# Patient Record
Sex: Female | Born: 1985 | Race: White | Hispanic: No | Marital: Single | Smoking: Current every day smoker
Health system: Southern US, Community
[De-identification: ages and names within clinical notes are randomized; demographics above are authoritative.]

## PROBLEM LIST (undated history)

## (undated) ENCOUNTER — Ambulatory Visit: Admission: EM

## (undated) DIAGNOSIS — Z803 Family history of malignant neoplasm of breast: Secondary | ICD-10-CM

## (undated) DIAGNOSIS — F419 Anxiety disorder, unspecified: Secondary | ICD-10-CM

## (undated) DIAGNOSIS — F32A Depression, unspecified: Secondary | ICD-10-CM

## (undated) DIAGNOSIS — I10 Essential (primary) hypertension: Secondary | ICD-10-CM

## (undated) DIAGNOSIS — I509 Heart failure, unspecified: Secondary | ICD-10-CM

## (undated) DIAGNOSIS — K219 Gastro-esophageal reflux disease without esophagitis: Secondary | ICD-10-CM

## (undated) HISTORY — DX: Anxiety disorder, unspecified: F41.9

## (undated) HISTORY — PX: LEEP: SHX91

## (undated) HISTORY — DX: Family history of malignant neoplasm of breast: Z80.3

## (undated) HISTORY — DX: Essential (primary) hypertension: I10

## (undated) HISTORY — DX: Depression, unspecified: F32.A

## (undated) HISTORY — DX: Gastro-esophageal reflux disease without esophagitis: K21.9

---
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
## 2008-10-31 ENCOUNTER — Emergency Department: Payer: Self-pay | Admitting: Unknown Physician Specialty

## 2009-10-11 ENCOUNTER — Encounter: Payer: Self-pay | Admitting: Surgery

## 2009-11-07 ENCOUNTER — Encounter: Payer: Self-pay | Admitting: Surgery

## 2011-01-03 IMAGING — US US PELV - US TRANSVAGINAL
1 series · 17 of 25 positions shown · non-contrast
Comparison: none

REASON FOR EXAM: pain left lower pelvic
COMMENTS:

PROCEDURE:     US  - US PELVIS EXAM W/TRANSVAGINAL  - October 31, 2008  [DATE]
RESULT:     Pelvic ultrasound dated 10/31/2008
TECHNIQUE: Transabdominal and endovaginal imaging of the pelvis is obtained.

[Series 1: us pelv - us transvaginal · 17 of 62 slices shown]
[im 1/62]
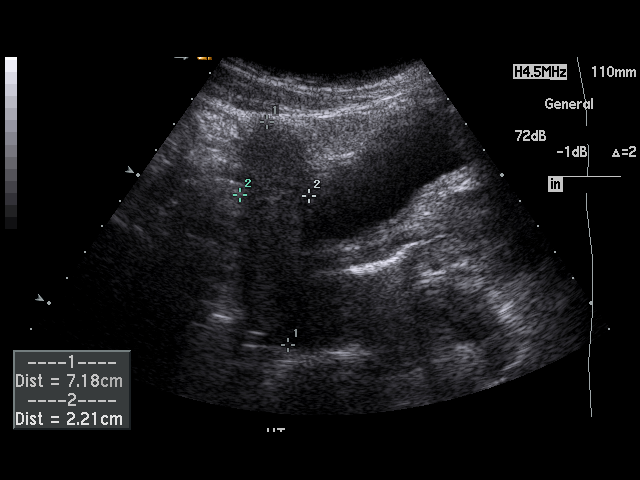
[im 6/62]
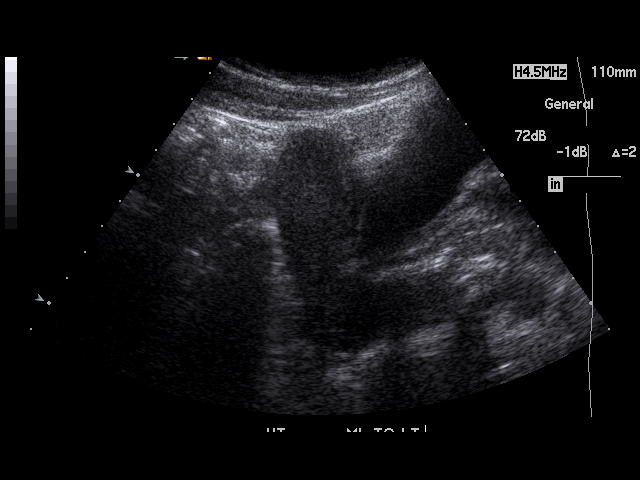
[im 8/62]
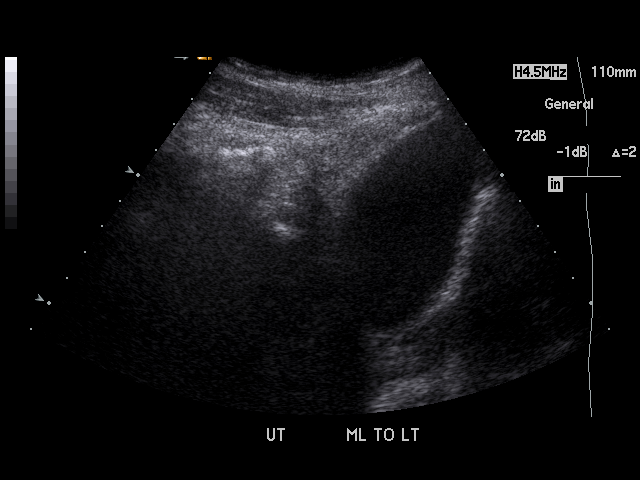
[im 13/62]
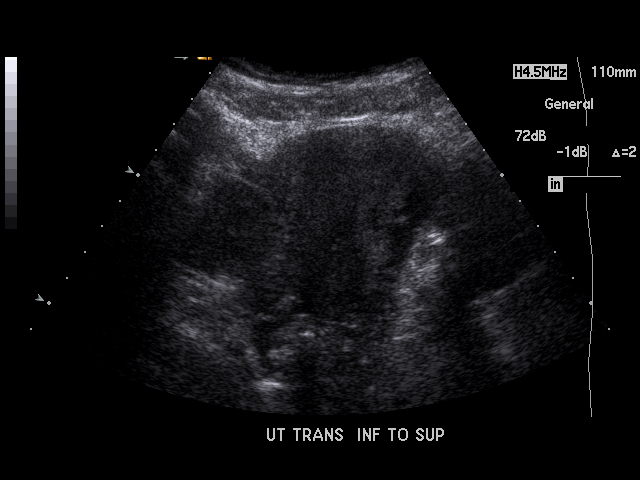
[im 16/62]
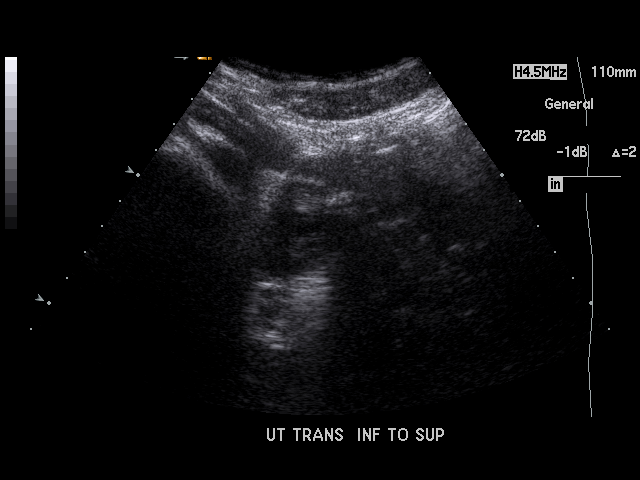
[im 21/62]
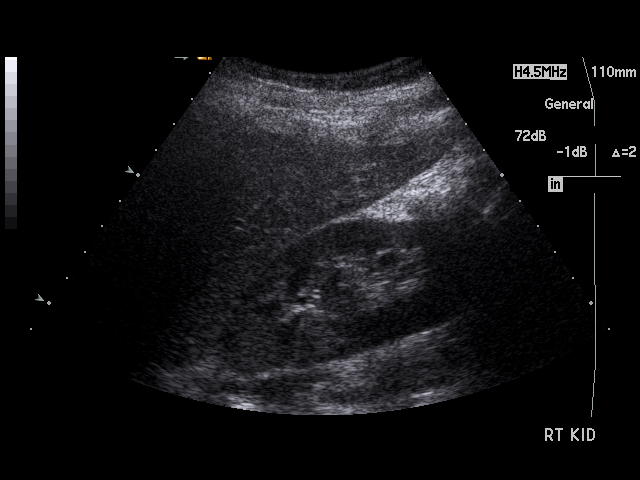
[im 23/62]
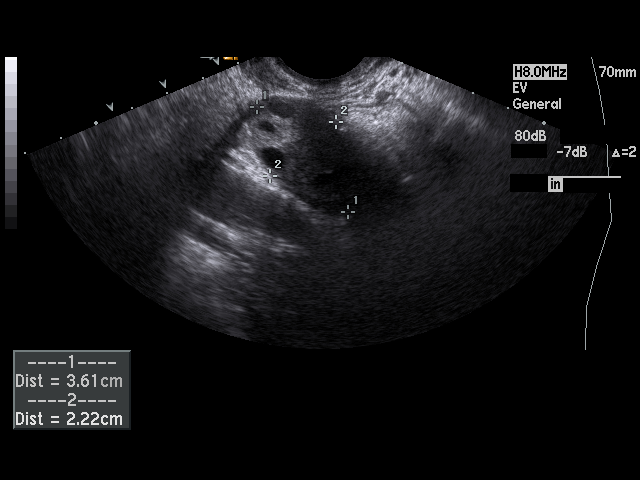
[im 28/62]
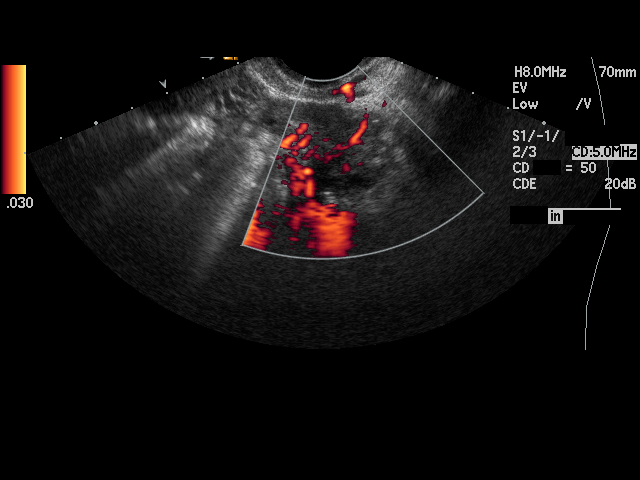
[im 31/62]
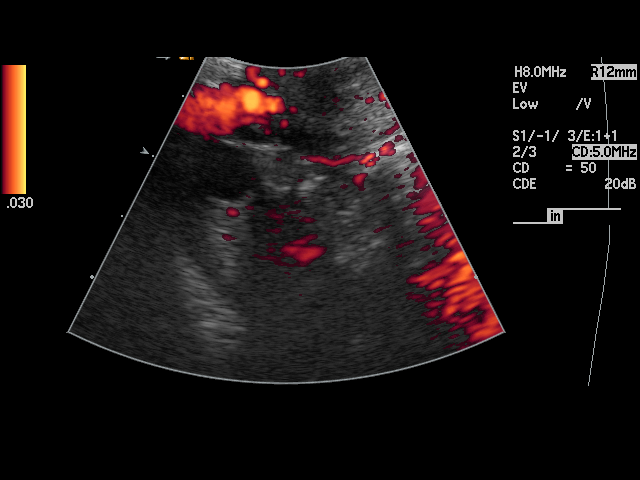
[im 34/62]
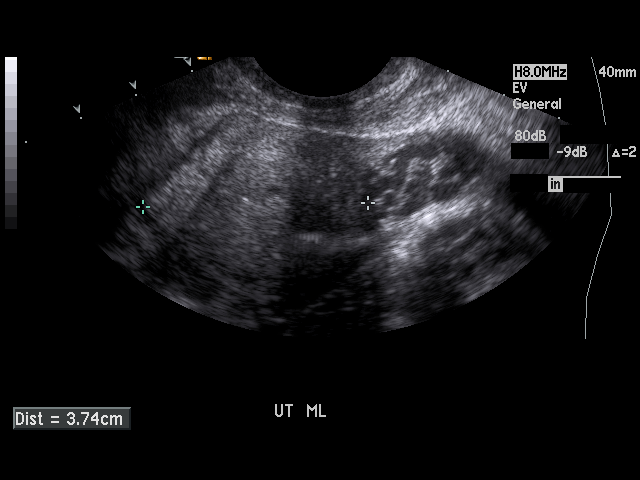
[im 39/62]
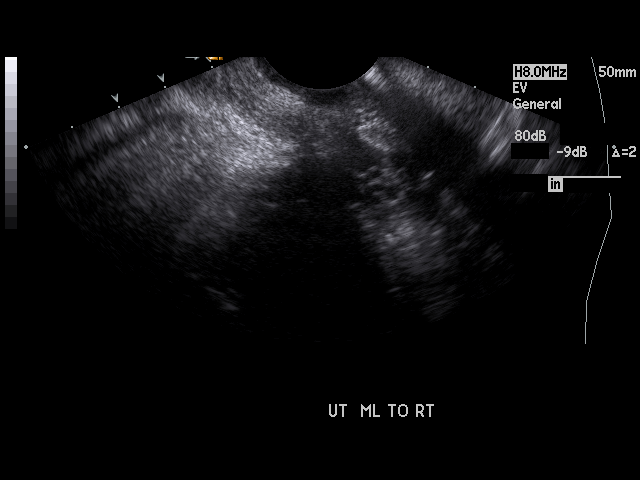
[im 41/62]
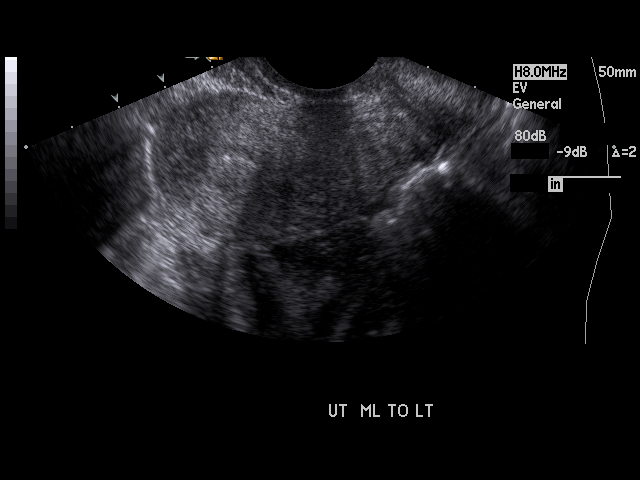
[im 46/62]
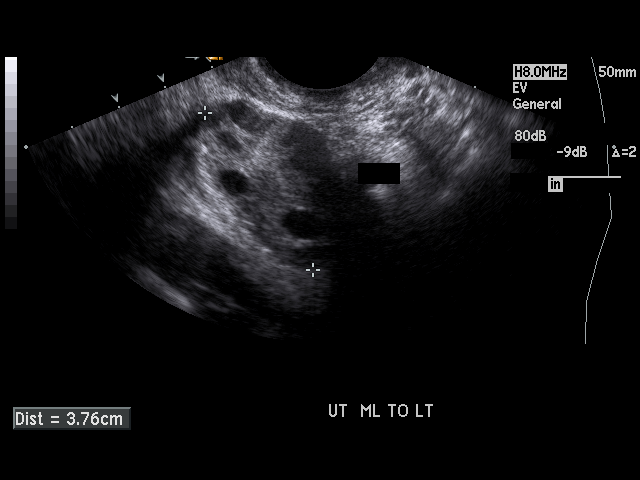
[im 49/62]
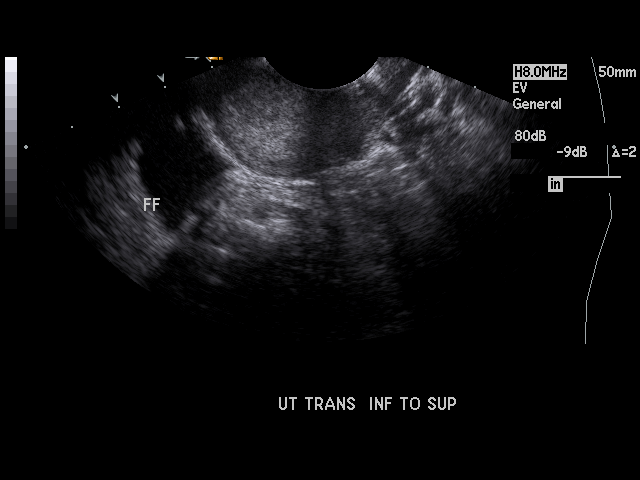
[im 54/62]
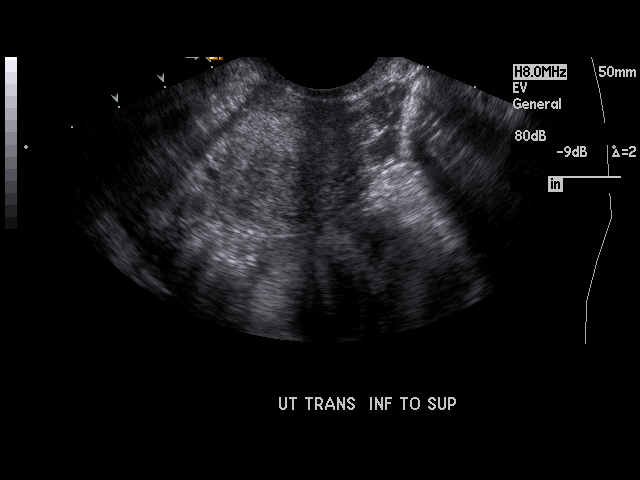
[im 56/62]
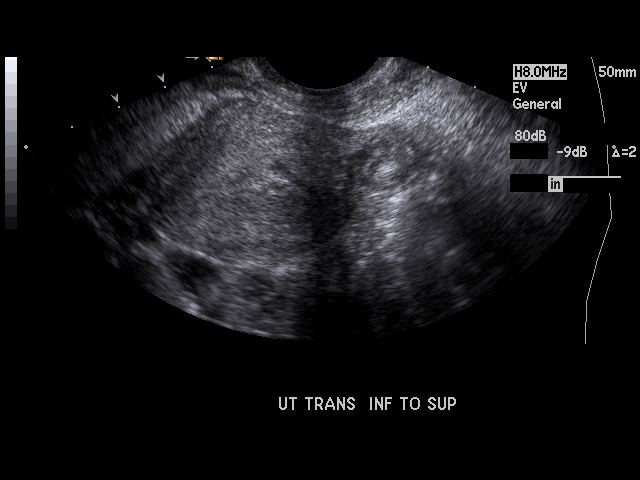
[im 62/62]
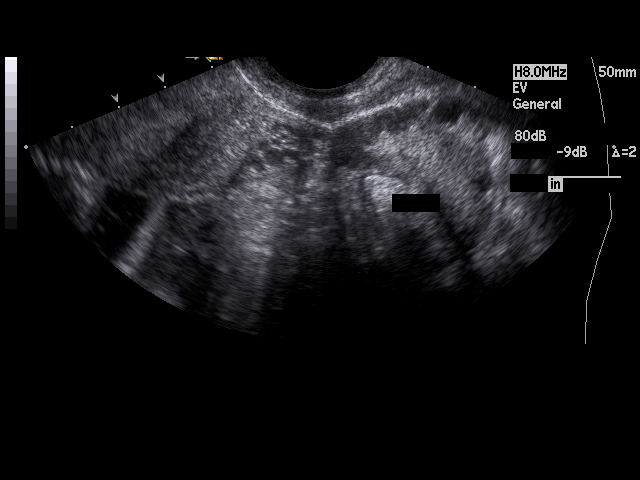

[17 of 25 positions shown; findings below may reference images not displayed]

The uterus demonstrates a homogeneous echotexture and measures 9.1 x 3.2 x
3.7 cm. A small amount of fluid is identified than the cul-de-sac. There is
no evidence of endometrial thickening. The right ovary measures 2.74 a 1.6 x
2.27 cm and the left 3.61 x 2.22 x 1.78 cm. Arterial and venous flow is
identified within the right and left ovaries. The ovaries demonstrate a
homogeneous echotexture. No evidence of pelvic mass is identified. The
urinary bladder is partially distended. Follicles are identified within the
right and left ovaries.
IMPRESSION: Unremarkable pelvic ultrasound. Small amount of fluid is
identified within the cul-de-sac this may be physiologic.

## 2012-04-04 ENCOUNTER — Emergency Department: Payer: Self-pay | Admitting: Emergency Medicine

## 2012-04-04 LAB — URINALYSIS, COMPLETE
Bilirubin,UR: NEGATIVE
Blood: NEGATIVE
Glucose,UR: NEGATIVE mg/dL (ref 0–75)
Ketone: NEGATIVE
Leukocyte Esterase: NEGATIVE
Nitrite: NEGATIVE
Ph: 7 (ref 4.5–8.0)
Protein: NEGATIVE
RBC,UR: NONE SEEN /HPF (ref 0–5)
Specific Gravity: 1.027 (ref 1.003–1.030)
Squamous Epithelial: 1
WBC UR: NONE SEEN /HPF (ref 0–5)

## 2013-07-14 ENCOUNTER — Ambulatory Visit: Payer: Self-pay

## 2013-12-22 DIAGNOSIS — H7292 Unspecified perforation of tympanic membrane, left ear: Secondary | ICD-10-CM | POA: Insufficient documentation

## 2014-06-07 IMAGING — CR DG LUMBAR SPINE 2-3V
1 series · 3 of 3 positions shown · non-contrast
Comparison: none

REASON FOR EXAM: lumbar spine pain
COMMENTS:

PROCEDURE:     DXR - DXR LUMBAR SPINE AP AND LATERAL  - April 04, 2012  [DATE]
RESULT:     Five non-rib bearing lumbar vertebral bodies are appreciated.
There is no evidence of fracture, dislocation or malalignment.

[Series 1: ap · 0.17mm/px · 3 of 3 slices shown]
[im 1/3]
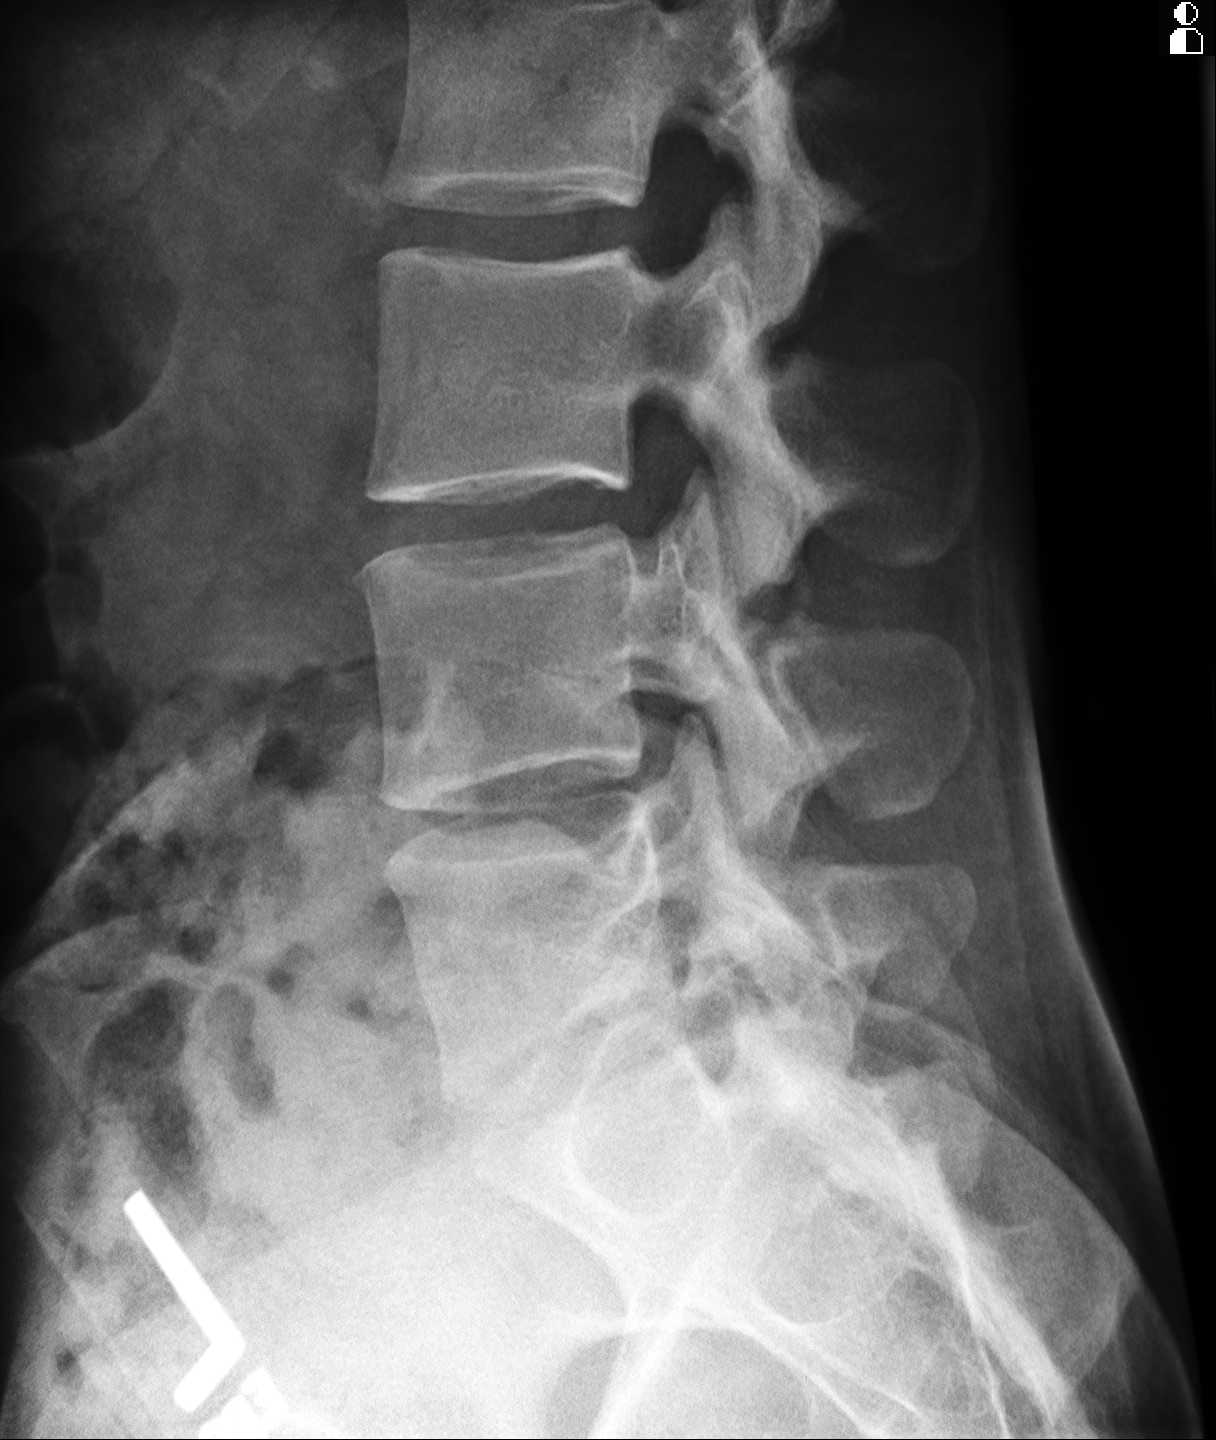
[im 2/3]
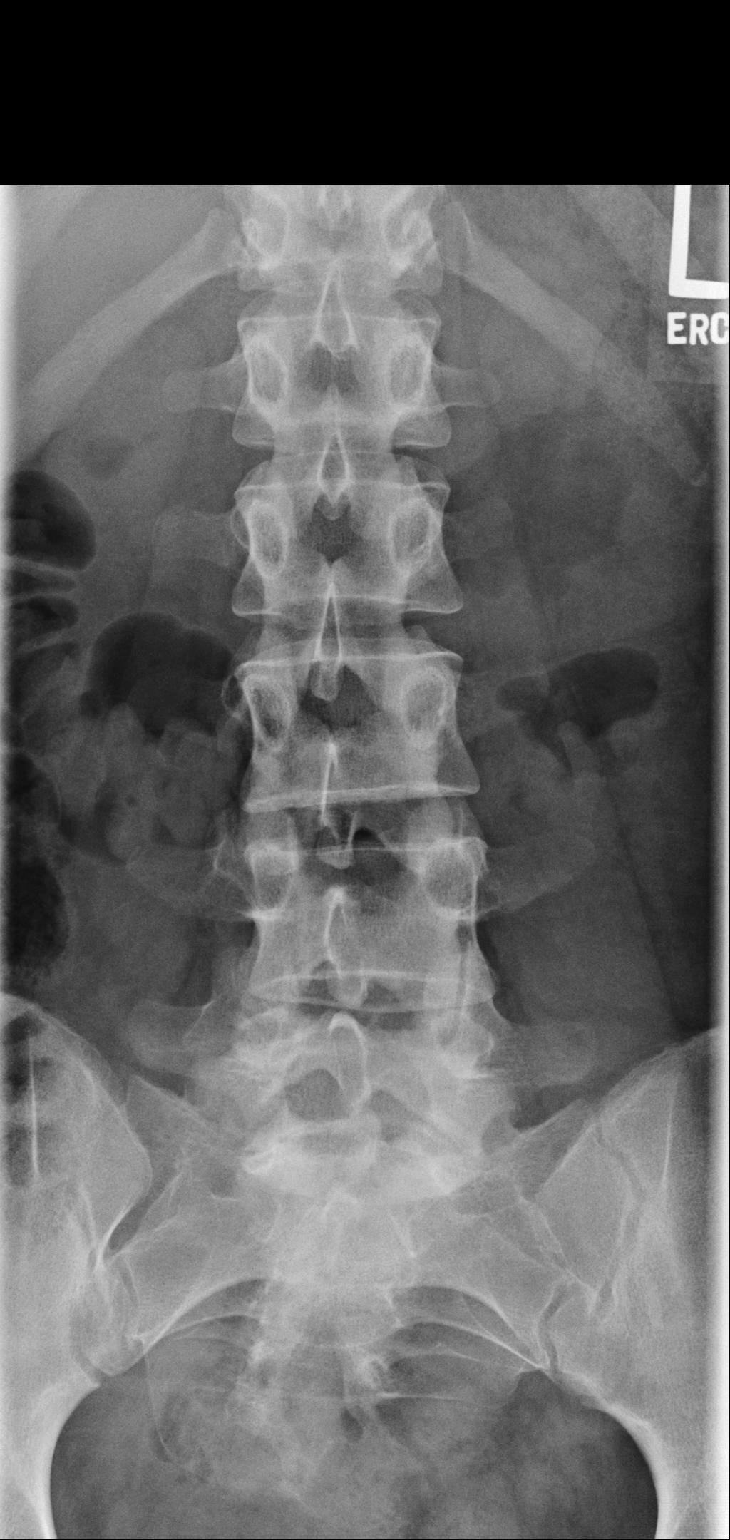
[im 3/3]
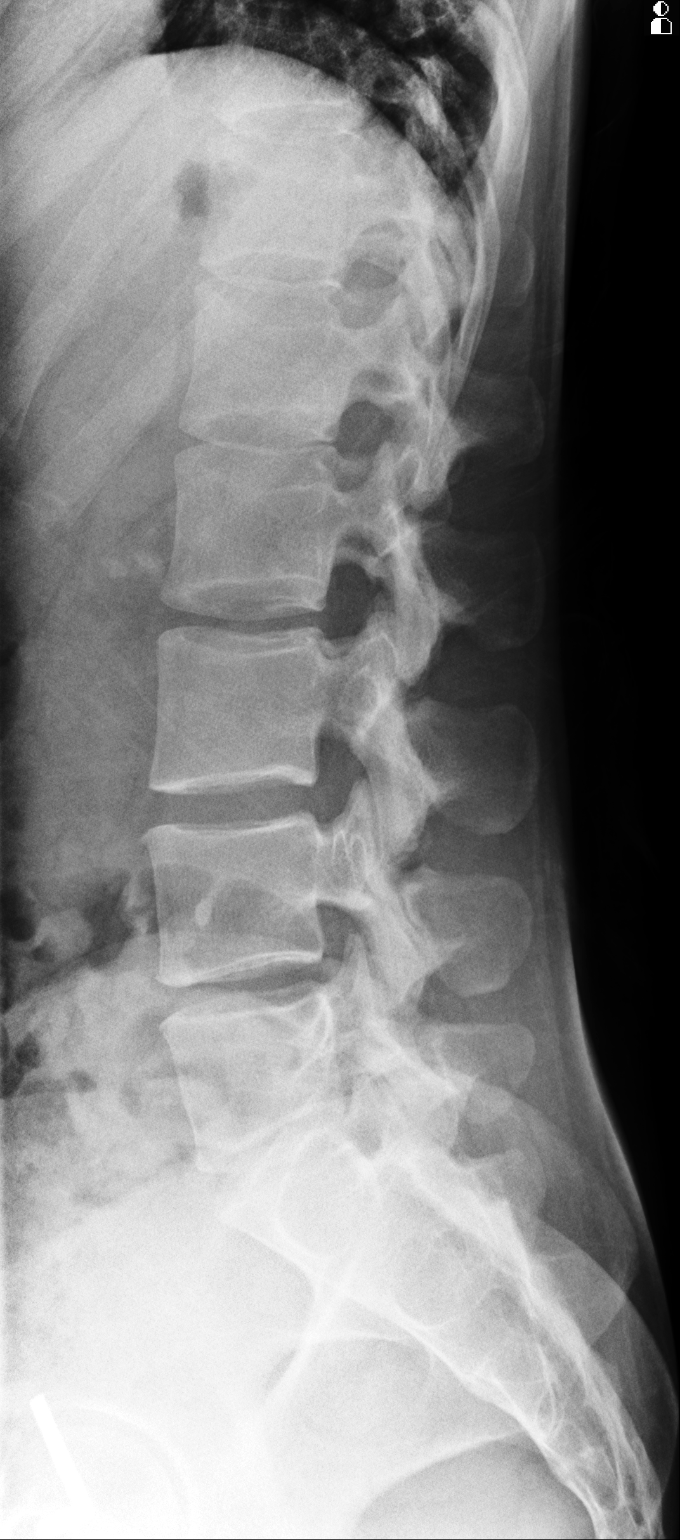

[3 of 3 positions shown; findings below may reference images not displayed]

IMPRESSION: 1. No acute osseous abnormalities.
2. If there is persistent clinical concern or persistent complaints of pain,
further evaluation with MRI is recommended.

## 2015-09-21 DIAGNOSIS — F329 Major depressive disorder, single episode, unspecified: Secondary | ICD-10-CM | POA: Insufficient documentation

## 2015-09-21 DIAGNOSIS — F32A Depression, unspecified: Secondary | ICD-10-CM | POA: Insufficient documentation

## 2015-09-21 DIAGNOSIS — F419 Anxiety disorder, unspecified: Secondary | ICD-10-CM | POA: Insufficient documentation

## 2015-09-21 DIAGNOSIS — Z8742 Personal history of other diseases of the female genital tract: Secondary | ICD-10-CM | POA: Insufficient documentation

## 2015-09-21 LAB — HM PAP SMEAR: HM Pap smear: NEGATIVE

## 2015-12-08 ENCOUNTER — Ambulatory Visit: Payer: Self-pay | Admitting: Family Medicine

## 2016-07-31 ENCOUNTER — Encounter: Payer: Self-pay | Admitting: Obstetrics & Gynecology

## 2018-02-18 LAB — HM HIV SCREENING LAB: HM HIV Screening: NEGATIVE

## 2018-08-12 DIAGNOSIS — Z8742 Personal history of other diseases of the female genital tract: Secondary | ICD-10-CM

## 2018-08-12 DIAGNOSIS — F419 Anxiety disorder, unspecified: Secondary | ICD-10-CM

## 2018-08-13 ENCOUNTER — Ambulatory Visit (LOCAL_COMMUNITY_HEALTH_CENTER): Payer: Self-pay

## 2018-08-13 ENCOUNTER — Other Ambulatory Visit: Payer: Self-pay

## 2018-08-13 VITALS — BP 119/75 | Ht 68.0 in | Wt 152.0 lb

## 2018-08-13 DIAGNOSIS — Z30013 Encounter for initial prescription of injectable contraceptive: Secondary | ICD-10-CM

## 2018-08-13 DIAGNOSIS — Z3009 Encounter for other general counseling and advice on contraception: Secondary | ICD-10-CM

## 2018-08-13 MED ORDER — MEDROXYPROGESTERONE ACETATE 150 MG/ML IM SUSP
150.0000 mg | Freq: Once | INTRAMUSCULAR | Status: AC
  Administered 2018-08-13: 16:00:00 150 mg via INTRAMUSCULAR

## 2018-08-13 NOTE — Progress Notes (Signed)
Depo given per E. Sciora CNM 12/08/17 order. Tolerated well. Aileen Fass, RN

## 2018-11-03 ENCOUNTER — Other Ambulatory Visit: Payer: Self-pay

## 2018-11-03 ENCOUNTER — Ambulatory Visit (LOCAL_COMMUNITY_HEALTH_CENTER): Payer: Self-pay

## 2018-11-03 VITALS — BP 118/75 | Ht 68.0 in | Wt 154.0 lb

## 2018-11-03 DIAGNOSIS — Z30013 Encounter for initial prescription of injectable contraceptive: Secondary | ICD-10-CM

## 2018-11-03 DIAGNOSIS — Z3009 Encounter for other general counseling and advice on contraception: Secondary | ICD-10-CM

## 2018-11-03 MED ORDER — MULTIVITAMINS PO CAPS: 1.0000 | ORAL_CAPSULE | Freq: Every day | ORAL | 0 refills | Status: DC

## 2018-11-03 MED ORDER — MEDROXYPROGESTERONE ACETATE 150 MG/ML IM SUSP
150.0000 mg | Freq: Once | INTRAMUSCULAR | Status: AC
  Administered 2018-11-03: 16:00:00 150 mg via INTRAMUSCULAR

## 2018-11-03 NOTE — Progress Notes (Signed)
Last physical at [facility] 12/08/2017. Last depo at [facility] 08/13/2018; 11.5 weeks post depo. DMPA 150 mg IM today per Donnal Moat, CNM order dated 12/08/2017.

## 2019-01-27 ENCOUNTER — Other Ambulatory Visit: Payer: Self-pay | Admitting: Physician Assistant

## 2019-01-27 DIAGNOSIS — Z3042 Encounter for surveillance of injectable contraceptive: Secondary | ICD-10-CM

## 2019-01-27 MED ORDER — MEDROXYPROGESTERONE ACETATE 150 MG/ML IM SUSP
150.0000 mg | Freq: Once | INTRAMUSCULAR | Status: AC
Start: 2019-01-28 — End: 2019-01-28
  Administered 2019-01-28: 17:00:00 150 mg via INTRAMUSCULAR

## 2019-01-28 ENCOUNTER — Other Ambulatory Visit: Payer: Self-pay

## 2019-01-28 ENCOUNTER — Ambulatory Visit (LOCAL_COMMUNITY_HEALTH_CENTER): Payer: Self-pay

## 2019-01-28 VITALS — BP 118/71 | Ht 68.0 in | Wt 166.0 lb

## 2019-01-28 DIAGNOSIS — Z30013 Encounter for initial prescription of injectable contraceptive: Secondary | ICD-10-CM

## 2019-01-28 DIAGNOSIS — Z3009 Encounter for other general counseling and advice on contraception: Secondary | ICD-10-CM

## 2019-01-29 NOTE — Progress Notes (Signed)
Pt here for Depo. Pt reports no issues with Depo and is satisfied with it as her birth control method. Pt's last Depo was 11/03/2018, so pt is 12 weeks and 2 days post last Depo. Pt received Depo 150mg  IM per , PA order from 01/27/2019, and pt counseled per 01/29/2019, PA to schedule to see a provider for her pap and depo when her next Depo is due. Pt states understanding.Sadie Haber, RN

## 2019-04-23 ENCOUNTER — Ambulatory Visit (LOCAL_COMMUNITY_HEALTH_CENTER): Payer: Medicaid Other

## 2019-04-23 ENCOUNTER — Other Ambulatory Visit: Payer: Self-pay

## 2019-04-23 DIAGNOSIS — Z3009 Encounter for other general counseling and advice on contraception: Secondary | ICD-10-CM

## 2019-04-23 DIAGNOSIS — Z719 Counseling, unspecified: Secondary | ICD-10-CM

## 2019-04-23 NOTE — Progress Notes (Signed)
See provider orders dated 01/27/19 and counseling done on 01/28/19 regarding PAP and depo.  Pt past due for PAP.  Unable to reschedule for provider visit today; pt to check her work schedule and will call back asking for a provider visit to address PAP and depo. Pt is 12.1 weeks post depo today and pt counseled regarding depo timeframe recommendations.

## 2019-04-26 ENCOUNTER — Ambulatory Visit (LOCAL_COMMUNITY_HEALTH_CENTER): Payer: Medicaid Other | Admitting: Family Medicine

## 2019-04-26 ENCOUNTER — Encounter: Payer: Self-pay | Admitting: Family Medicine

## 2019-04-26 ENCOUNTER — Ambulatory Visit: Payer: Medicaid Other

## 2019-04-26 ENCOUNTER — Other Ambulatory Visit: Payer: Self-pay

## 2019-04-26 VITALS — BP 107/71 | Ht 68.0 in | Wt 180.2 lb

## 2019-04-26 DIAGNOSIS — Z30013 Encounter for initial prescription of injectable contraceptive: Secondary | ICD-10-CM

## 2019-04-26 DIAGNOSIS — N841 Polyp of cervix uteri: Secondary | ICD-10-CM

## 2019-04-26 DIAGNOSIS — Z3009 Encounter for other general counseling and advice on contraception: Secondary | ICD-10-CM | POA: Diagnosis not present

## 2019-04-26 MED ORDER — MEDROXYPROGESTERONE ACETATE 150 MG/ML IM SUSP
150.0000 mg | INTRAMUSCULAR | Status: AC
  Administered 2019-04-26 – 2020-01-13 (×4): 150 mg via INTRAMUSCULAR

## 2019-04-26 NOTE — Progress Notes (Signed)
Here today for Depo and Pap Smear. Last PE here was 12/08/2017. Last Pap Smear was 09/16/2015. Last Depo was 01/28/2019. Tawny Hopping, RN

## 2019-04-26 NOTE — Progress Notes (Signed)
Family Planning Visit  Subjective:  Maria Gallegos is a 34 y.o. being seen today for  Chief Complaint  Patient presents with  . Contraception  . Gynecologic Exam    Pt has Depression; Anxiety; History of abnormal cervical Pap smear; and Perforated tympanic membrane, left on their problem list.  HPI  Patient reports she is here for Depo and pap. She is [redacted]w[redacted]d since last depo injection. She has been on depo 10+ yrs.   Pt denies all of the following, which are contraindications to Depo use: Known breast cancer Pregnancy Also denies: Hypertension (CDC cat 2 if mild, cat 3 if severe) Severe cirrhosis, hepatocellular adenoma Diabetes with nephrosis or vascular complications Ischemic heart disease or multiple risk factors for atherosclerotic disease, and some forms of lupus Unexplained vaginal bleeding Pregnancy planned within the next year Long-term use of corticosteroid therapy in women with a history of, or risk factors for, nontraumatic (frailty) fractures.  Current use of aminoglutethimide (usually for the treatment of Cushing's syndrome) because aminoglutethimide may increase metabolism of progestins    No LMP recorded. Patient has had an injection. Last sex: early Jan  BCM: depo Pt desires EC? n/a  Last pap: 09/21/2015: NIL, HPV negative. 09/19/2014: NIL, HPV neg. LEEP in 2015.  Last breast exam: ?  Patient reports 1 partner(s) in last year. Do they desire STI screening (if no, why not)? No, declines  Does the patient desire a pregnancy in the next year? No    34 y.o., Body mass index is 27.4 kg/m. - Is patient eligible for HA1C diabetes screening based on BMI and age >67?  no  Does the patient have a current or past history of drug use? no No components found for: HCV  See flowsheet for other program required questions.   Health Maintenance Due  Topic Date Due  . COVID-19 Vaccine (1) Never done  . TETANUS/TDAP  Never done  . PAP SMEAR-Modifier  09/21/2018     ROS  The following portions of the patient's history were reviewed and updated as appropriate: allergies, current medications, past family history, past medical history, past social history, past surgical history and problem list. Problem list updated.  Objective:  BP 107/71   Ht 5\' 8"  (1.727 m)   Wt 180 lb 3.2 oz (81.7 kg)   BMI 27.40 kg/m    Physical Exam Vitals and nursing note reviewed.  Constitutional:      Appearance: Normal appearance.  HENT:     Head: Normocephalic and atraumatic.     Mouth/Throat:     Mouth: Mucous membranes are moist.     Pharynx: Oropharynx is clear. No oropharyngeal exudate or posterior oropharyngeal erythema.  Pulmonary:     Effort: Pulmonary effort is normal.  Chest:     Breasts:        Right: Normal. No swelling, bleeding, inverted nipple, mass, nipple discharge, skin change or tenderness.        Left: Normal. No swelling, bleeding, inverted nipple, mass, nipple discharge, skin change or tenderness.  Abdominal:     General: Abdomen is flat.     Palpations: There is no mass.     Tenderness: There is no abdominal tenderness. There is no rebound.  Genitourinary:    General: Normal vulva.     Exam position: Lithotomy position.     Pubic Area: No rash or pubic lice.      Labia:        Right: No rash or lesion.  Left: No rash or lesion.      Vagina: Normal. No vaginal discharge, erythema, bleeding or lesions.     Cervix: No cervical motion tenderness, discharge, friability, lesion or erythema.     Uterus: Normal.      Adnexa: Right adnexa normal and left adnexa normal.     Rectum: Normal.        Comments: Cervix with beefy red vasculation obscuring os. Ill defined borders, s/p LEEP in 2015. Lymphadenopathy:     Head:     Right side of head: No preauricular or posterior auricular adenopathy.     Left side of head: No preauricular or posterior auricular adenopathy.     Cervical: No cervical adenopathy.     Upper Body:     Right  upper body: No supraclavicular or axillary adenopathy.     Left upper body: No supraclavicular or axillary adenopathy.     Lower Body: No right inguinal adenopathy. No left inguinal adenopathy.  Skin:    General: Skin is warm and dry.     Findings: No rash.  Neurological:     Mental Status: She is alert and oriented to person, place, and time.      Assessment and Plan:  Maria Gallegos is a 34 y.o. female presenting to the [facility] Department for a well woman exam/family planning visit  Contraception counseling: Reviewed all forms of birth control options in the tiered based approach including abstinence; over the counter/barrier methods; hormonal contraceptive medication including pill, patch, ring, injection, contraceptive implant; hormonal and nonhormonal IUDs; permanent sterilization options including vasectomy and the various tubal sterilization modalities. Risks, benefits, how to discontinue and typical effectiveness rates were reviewed.  Questions were answered.  Written information was also given to the patient to review.  Patient desires depo, this was prescribed for patient. She will follow up in  3 months for surveillance.  She was told to call with any further questions, or with any concerns about this method of contraception.  Emphasized use of condoms 100% of the time for STI prevention.  Emergency Contraception: n/a   1. Family planning services -Rx depo x1 yr. Counseling as above, including possible risks of Depo use >2 yrs to BMD. Alternative BCM discussed, pt would like to continue Depo. Suggested calcium (1200 mg daily) and vitamin D (800 IU daily) supplements, weight bearing exercise and avoiding cigarette smoking and excessive alcohol consumption to promote bone health.  -Follow up in 3 months for repeat Depo Provera injection.  -Pap today, though difficult to obtain d/t what is likely a cervical polyp.   - medroxyPROGESTERone (DEPO-PROVERA) injection 150  mg - IGP, Aptima HPV  2. Polyp at cervical os -Referred to [facility] gynecology for further investigation.     Return in about 3 months (around 07/26/2019) for Depo.  No future appointments.  Kandee Keen, PA-C

## 2019-04-26 NOTE — Progress Notes (Signed)
Depo given and tolerated well. Referral faxed to [facility]. Tawny Hopping, RN

## 2019-04-27 ENCOUNTER — Encounter: Payer: Self-pay | Admitting: Family Medicine

## 2019-04-29 LAB — IGP, APTIMA HPV
HPV Aptima: NEGATIVE
PAP Smear Comment: 0

## 2019-07-15 ENCOUNTER — Ambulatory Visit (LOCAL_COMMUNITY_HEALTH_CENTER): Payer: Medicaid Other

## 2019-07-15 ENCOUNTER — Other Ambulatory Visit: Payer: Self-pay

## 2019-07-15 VITALS — BP 118/74 | Ht 68.5 in | Wt 176.5 lb

## 2019-07-15 DIAGNOSIS — Z3009 Encounter for other general counseling and advice on contraception: Secondary | ICD-10-CM | POA: Diagnosis not present

## 2019-07-15 DIAGNOSIS — Z30013 Encounter for initial prescription of injectable contraceptive: Secondary | ICD-10-CM | POA: Diagnosis not present

## 2019-07-15 MED ORDER — MULTI-VITAMIN/MINERALS PO TABS: 1.0000 | ORAL_TABLET | Freq: Every day | ORAL | 0 refills | Status: DC

## 2019-07-15 NOTE — Progress Notes (Signed)
11 weeks 3 days post depo. DMPA 150 mg IM given today per order by Maximiano Coss, PA-C dated 04/26/19. Pt tolerated well. Next depo due 09/30/19. Consent signed 12/08/17 and on file. Multivitamins dispensed today. Jerel Shepherd, RN

## 2019-10-08 ENCOUNTER — Other Ambulatory Visit: Payer: Self-pay

## 2019-10-08 ENCOUNTER — Ambulatory Visit (LOCAL_COMMUNITY_HEALTH_CENTER): Payer: Medicaid Other

## 2019-10-08 VITALS — BP 127/75 | Ht 68.5 in | Wt 175.0 lb

## 2019-10-08 DIAGNOSIS — Z30013 Encounter for initial prescription of injectable contraceptive: Secondary | ICD-10-CM

## 2019-10-08 DIAGNOSIS — Z3009 Encounter for other general counseling and advice on contraception: Secondary | ICD-10-CM

## 2019-10-08 MED ORDER — MULTI-VITAMIN/MINERALS PO TABS: 1.0000 | ORAL_TABLET | Freq: Every day | ORAL | 0 refills | Status: DC

## 2019-10-08 NOTE — Progress Notes (Signed)
12 weeks 1 day post depo. Depo consent signed today. DMPA 150mg  IM given Left UOQ today per order by PA-C dated 04/26/2019. Tolerated well. Multivitamins dispensed today. Next depo due 12/24/2019, pt aware. 12/26/2019, RN

## 2020-01-13 ENCOUNTER — Other Ambulatory Visit: Payer: Self-pay

## 2020-01-13 ENCOUNTER — Ambulatory Visit (LOCAL_COMMUNITY_HEALTH_CENTER): Payer: Medicaid Other

## 2020-01-13 VITALS — BP 116/84 | Ht 68.5 in | Wt 182.0 lb

## 2020-01-13 DIAGNOSIS — Z30013 Encounter for initial prescription of injectable contraceptive: Secondary | ICD-10-CM | POA: Diagnosis not present

## 2020-01-13 DIAGNOSIS — Z3009 Encounter for other general counseling and advice on contraception: Secondary | ICD-10-CM | POA: Diagnosis not present

## 2020-01-13 NOTE — Progress Notes (Signed)
13 weeks 6 days since last depo. Depo 150 mg IM given R UOQ per order by Maximiano Coss, PA-C dated 04/26/2019. Tolerated well. Next depo due 03/30/2020, pt aware. Jerel Shepherd, RN

## 2020-01-14 ENCOUNTER — Ambulatory Visit: Payer: Medicaid Other

## 2020-03-31 ENCOUNTER — Ambulatory Visit (LOCAL_COMMUNITY_HEALTH_CENTER): Payer: Medicaid Other

## 2020-03-31 ENCOUNTER — Other Ambulatory Visit: Payer: Self-pay

## 2020-03-31 VITALS — BP 135/81 | Wt 178.0 lb

## 2020-03-31 DIAGNOSIS — Z30013 Encounter for initial prescription of injectable contraceptive: Secondary | ICD-10-CM

## 2020-03-31 DIAGNOSIS — Z3009 Encounter for other general counseling and advice on contraception: Secondary | ICD-10-CM | POA: Diagnosis not present

## 2020-03-31 MED ORDER — MEDROXYPROGESTERONE ACETATE 150 MG/ML IM SUSP
150.0000 mg | Freq: Once | INTRAMUSCULAR | Status: AC
  Administered 2020-03-31: 150 mg via INTRAMUSCULAR

## 2020-03-31 NOTE — Progress Notes (Signed)
Depo given per Maximiano Coss PA order (04/2019) in left glut.  Tolerated well. Co for next Depo, make PE appt. Richmond Campbell, RN

## 2020-06-30 ENCOUNTER — Ambulatory Visit (LOCAL_COMMUNITY_HEALTH_CENTER): Payer: Medicaid Other | Admitting: Advanced Practice Midwife

## 2020-06-30 ENCOUNTER — Other Ambulatory Visit: Payer: Self-pay

## 2020-06-30 VITALS — BP 141/80 | HR 84

## 2020-06-30 DIAGNOSIS — Z3042 Encounter for surveillance of injectable contraceptive: Secondary | ICD-10-CM | POA: Diagnosis not present

## 2020-06-30 DIAGNOSIS — R030 Elevated blood-pressure reading, without diagnosis of hypertension: Secondary | ICD-10-CM | POA: Insufficient documentation

## 2020-06-30 DIAGNOSIS — Z3009 Encounter for other general counseling and advice on contraception: Secondary | ICD-10-CM | POA: Diagnosis not present

## 2020-06-30 DIAGNOSIS — F17200 Nicotine dependence, unspecified, uncomplicated: Secondary | ICD-10-CM

## 2020-06-30 DIAGNOSIS — F1010 Alcohol abuse, uncomplicated: Secondary | ICD-10-CM

## 2020-06-30 DIAGNOSIS — Z8742 Personal history of other diseases of the female genital tract: Secondary | ICD-10-CM

## 2020-06-30 LAB — WET PREP FOR TRICH, YEAST, CLUE
Trichomonas Exam: NEGATIVE
Yeast Exam: NEGATIVE

## 2020-06-30 MED ORDER — MEDROXYPROGESTERONE ACETATE 150 MG/ML IM SUSP
150.0000 mg | INTRAMUSCULAR | Status: AC
  Administered 2020-06-30 – 2021-04-20 (×4): 150 mg via INTRAMUSCULAR

## 2020-06-30 NOTE — Progress Notes (Signed)
Here for PE , would like to continue her depo. Due for one. Last depo on 03/31/20. Last Pap 04/26/19 NILM. BP 141/80, BP was rechecked end of visit, 115/75.

## 2020-06-30 NOTE — Progress Notes (Signed)
Wet mount reviewed. No tx per standing orders.All providers orders completed.

## 2020-06-30 NOTE — Progress Notes (Signed)
[facility] DEPARTMENT [facility] [address] Main Number: [phone]    Family Planning Visit- Initial Visit  Subjective:  Maria Gallegos is a 35 y.o. SWF smoker G1P1001 (33 yo daughter)  being seen today for an initial annual visit and to get more DMPA.  The patient is currently using Depo Provera for pregnancy prevention. Patient reports she does not want a pregnancy in the next year.  Patient has the following medical conditions has Depression; Anxiety; History of abnormal cervical Pap smear; Perforated tympanic membrane, left; and Elevated blood pressure reading 141/80 on 06/30/20 on their problem list.  Chief Complaint  Patient presents with   Annual Exam    Patient reports wants more DMPA. Last sex 2021. Smoker 1/2-1 ppd. Last ETOH last night (1 beer +1 shot) daily. Last PE 04/26/19. Last CBE 04/26/19. LMP not on DMPA. Last pap 04/26/19 neg HPV neg. Cervical polyp removed 05/18/19 at [facility]: benign ectocervical mucosa with dilated submucosal vessels. LEEP 2015 with normal paps since. 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  Last DMPA 03/31/20  Patient denies vaping, cigars, MJ  There is no height or weight on file to calculate BMI. - Patient is eligible for diabetes screening based on BMI and age >31?  not applicable HA1C ordered? no  Patient reports 0  partner/s in last year. Desires STI screening?  Yes  Has patient been screened once for HCV in the past?  No  No results found for: HCVAB  Does the patient have current drug use (including MJ), have a partner with drug use, and/or has been incarcerated since last result? No  If yes-- Screen for HCV through [facility]   Does the patient meet criteria for HBV testing? No  Criteria:  -Household, sexual or needle sharing contact with HBV -History of drug use -HIV positive -Those with known Hep C   Health Maintenance Due  Topic Date Due   COVID-19 Vaccine (1) Never done    Pneumococcal Vaccine 60-33 Years old (1 - PCV) Never done   Hepatitis C Screening  Never done    Review of Systems  All other systems reviewed and are negative.  The following portions of the patient's history were reviewed and updated as appropriate: allergies, current medications, past family history, past medical history, past social history, past surgical history and problem list. Problem list updated.   See flowsheet for other program required questions.  Objective:   Vitals:   06/30/20 1430  BP: (!) 141/80  Pulse: 84    Physical Exam Constitutional:      Appearance: Normal appearance. She is obese.  HENT:     Head: Normocephalic and atraumatic.     Mouth/Throat:     Mouth: Mucous membranes are moist.     Comments: States last dental exam 2021 2x/yr Eyes:     Conjunctiva/sclera: Conjunctivae normal.  Neck:     Thyroid: No thyroid mass, thyromegaly or thyroid tenderness.  Cardiovascular:     Rate and Rhythm: Normal rate and regular rhythm.  Pulmonary:     Effort: Pulmonary effort is normal.     Breath sounds: Normal breath sounds.  Abdominal:     Palpations: Abdomen is soft.     Comments: Soft without masses or tenderness  Genitourinary:    General: Normal vulva.     Exam position: Lithotomy position.     Vagina: Vaginal discharge (creamy white leukorrhea, ph<4.5) present.     Rectum: Normal.  Musculoskeletal:        General: Normal range of motion.     Cervical back: Normal range of motion and neck supple.  Skin:    General: Skin is warm and dry.  Neurological:     Mental Status: She is alert.  Psychiatric:        Mood and Affect: Mood normal.      Assessment and Plan:  [name] is a 35 y.o. female presenting to the [facility] Department for an initial annual wellness/contraceptive visit  Contraception counseling: Reviewed all forms of birth control options in the tiered based approach. available including abstinence; over the  counter/barrier methods; hormonal contraceptive medication including pill, patch, ring, injection,contraceptive implant, ECP; hormonal and nonhormonal IUDs; permanent sterilization options including vasectomy and the various tubal sterilization modalities. Risks, benefits, and typical effectiveness rates were reviewed.  Questions were answered.  Written information was also given to the patient to review.  Patient desires continuation of DMPA, this was prescribed for patient. She will follow up in 11-13 wks for surveillance.  She was told to call with any further questions, or with any concerns about this method of contraception.  Emphasized use of condoms 100% of the time for STI prevention.  Patient was offered ECP. ECP was not accepted by the patient. ECP counseling was not given - see RN documentation  1. History of abnormal cervical Pap smear Last pap 04/26/19 neg HPV neg Next cotest due 04/2024  2. Family planning Treat wet mount per standing orders Immunization nurse consult  - WET PREP FOR TRICH, YEAST, CLUE - Chlamydia/Gonorrhea [facility]  3. Encounter for surveillance of injectable contraceptive May have DMPA 150 mg IM q 11-13 wks x 1 year  4. Elevated blood pressure reading 141/80 on 06/30/20 Please refer pt to primary care MD for evaluation Repeat BP=wnl     No follow-ups on file.  No future appointments.  Alberteen Spindle, CNM

## 2020-10-06 ENCOUNTER — Ambulatory Visit: Payer: Medicaid Other

## 2020-10-11 ENCOUNTER — Ambulatory Visit: Payer: Medicaid Other

## 2020-10-13 ENCOUNTER — Other Ambulatory Visit: Payer: Self-pay

## 2020-10-13 ENCOUNTER — Ambulatory Visit (LOCAL_COMMUNITY_HEALTH_CENTER): Payer: Medicaid Other | Admitting: Family Medicine

## 2020-10-13 ENCOUNTER — Ambulatory Visit: Payer: Medicaid Other

## 2020-10-13 ENCOUNTER — Encounter: Payer: Self-pay | Admitting: Family Medicine

## 2020-10-13 VITALS — BP 94/62 | HR 71 | Temp 97.0°F | Resp 18 | Ht 69.0 in | Wt 177.0 lb

## 2020-10-13 DIAGNOSIS — Z3009 Encounter for other general counseling and advice on contraception: Secondary | ICD-10-CM | POA: Diagnosis not present

## 2020-10-13 DIAGNOSIS — Z3042 Encounter for surveillance of injectable contraceptive: Secondary | ICD-10-CM | POA: Diagnosis not present

## 2020-10-13 NOTE — Progress Notes (Signed)
Patient is here for acute visit to receive depo injection.   Patient is 15wks post depo. Last depo was given on 06/30/20 during annual physical appointment with Hazle Coca, CNM.   Patient has no concerns nor complains. LMP is unknown as patient states she does not get period with depo. Last sex was "before my last depo".   Depo given today per Hazle Coca, CNM dated 06/30/20. Depo administered RUOQ  tolerated well. Next Depo due 12/29/20. Reminder card given to patient.    Floy Sabina, RN

## 2020-10-15 NOTE — Progress Notes (Signed)
This provider did not see patient.    Wendi Snipes, FNP

## 2021-01-19 ENCOUNTER — Ambulatory Visit (LOCAL_COMMUNITY_HEALTH_CENTER): Payer: Medicaid Other

## 2021-01-19 ENCOUNTER — Other Ambulatory Visit: Payer: Self-pay

## 2021-01-19 VITALS — BP 114/76 | Ht 69.0 in | Wt 174.0 lb

## 2021-01-19 DIAGNOSIS — Z3042 Encounter for surveillance of injectable contraceptive: Secondary | ICD-10-CM

## 2021-01-19 DIAGNOSIS — Z3009 Encounter for other general counseling and advice on contraception: Secondary | ICD-10-CM | POA: Diagnosis not present

## 2021-01-19 NOTE — Progress Notes (Signed)
DMPA 150 mg given IM (Left Glute) per 06/30/2020 order by Donnal Moat, CNM and tolerated well.  Patient is 14 weeks post last Depo. Dahlia Bailiff, RN

## 2021-04-06 ENCOUNTER — Ambulatory Visit: Payer: Medicaid Other

## 2021-04-20 ENCOUNTER — Ambulatory Visit (LOCAL_COMMUNITY_HEALTH_CENTER): Payer: Medicaid Other

## 2021-04-20 VITALS — BP 127/81 | Ht 69.0 in | Wt 178.0 lb

## 2021-04-20 DIAGNOSIS — Z3009 Encounter for other general counseling and advice on contraception: Secondary | ICD-10-CM | POA: Diagnosis not present

## 2021-04-20 DIAGNOSIS — Z3042 Encounter for surveillance of injectable contraceptive: Secondary | ICD-10-CM | POA: Diagnosis not present

## 2021-04-20 NOTE — Progress Notes (Signed)
13 weeks 0 days post depo. Voices no concerns. Depo given today per order by Hazle Coca, CNM dated 06/30/2020. Tolerated well RUOQ. Next depo and return PE due when next depo is due which is 07/06/2021, has reminder. Jerel Shepherd, RN ? ?

## 2021-07-19 ENCOUNTER — Ambulatory Visit (LOCAL_COMMUNITY_HEALTH_CENTER): Payer: Medicaid Other | Admitting: Advanced Practice Midwife

## 2021-07-19 ENCOUNTER — Encounter: Payer: Self-pay | Admitting: Advanced Practice Midwife

## 2021-07-19 VITALS — BP 111/77 | HR 78 | Ht 69.0 in | Wt 179.0 lb

## 2021-07-19 DIAGNOSIS — Z3009 Encounter for other general counseling and advice on contraception: Secondary | ICD-10-CM | POA: Diagnosis not present

## 2021-07-19 DIAGNOSIS — Z3042 Encounter for surveillance of injectable contraceptive: Secondary | ICD-10-CM | POA: Diagnosis not present

## 2021-07-19 NOTE — Progress Notes (Signed)
[facility] DEPARTMENT [facility] [address] Main Number: [phone]    Family Planning Visit- Initial Visit  Subjective:  Maria Gallegos is a 35 y.o.  G1P1001   being seen today for an initial annual visit and to discuss reproductive life planning.  The patient is currently using Hormonal Injection for pregnancy prevention. Patient reports   does not want a pregnancy in the next year.     report they are looking for a method that provides High efficacy at preventing pregnancy  Patient has the following medical conditions has Depression; Anxiety; History of abnormal cervical Pap smear; Perforated tympanic membrane, left; Elevated blood pressure reading 141/80 on 06/30/20; Smoker 1/2-1 ppd; and Alcohol abuse, daily use on their problem list.  Chief Complaint  Patient presents with   Contraception    Family planning     Patient reports smoking 1/2-1 ppd. Last cigar 8 years ago. Last ETOH last night and daily (1 beer). Last sex 07/14/21 with condom; with current partner x 1 year; 1 partner in last 3 mo. Last dental exam 04/2021. Working 25 hrs/wk and living with her 68 yo daughter. No menses on DMPA. Last DMPA 04/20/21 LEEP 2015. Last pap 04/26/19 neg HPV neg.  Last PE 06/30/20. Her GI doctor in [place] has been giving her meds trying to decide if she has IBS  Patient denies vaping   Body mass index is 26.43 kg/m. - Patient is eligible for diabetes screening based on BMI and age >53?  not applicable HA1C ordered? not applicable  Patient reports 1  partner/s in last year. Desires STI screening?  No - pt refuses all STD screening and bloodwork  Has patient been screened once for HCV in the past?  No  No results found for: "HCVAB"  Does the patient have current drug use (including MJ), have a partner with drug use, and/or has been incarcerated since last result? No  If yes-- Screen for HCV through [facility]   Does the patient meet criteria  for HBV testing? No  Criteria:  -Household, sexual or needle sharing contact with HBV -History of drug use -HIV positive -Those with known Hep C   Health Maintenance Due  Topic Date Due   COVID-19 Vaccine (1) Never done   Hepatitis C Screening  Never done    Review of Systems  All other systems reviewed and are negative.   The following portions of the patient's history were reviewed and updated as appropriate: allergies, current medications, past family history, past medical history, past social history, past surgical history and problem list. Problem list updated.   See flowsheet for other program required questions.  Objective:   Vitals:   07/19/21 1557  BP: 111/77  Pulse: 78  Weight: 179 lb (81.2 kg)  Height: 5\' 9"  (1.753 m)    Physical Exam Constitutional:      Appearance: Normal appearance. She is normal weight.  HENT:     Head: Normocephalic and atraumatic.     Mouth/Throat:     Mouth: Mucous membranes are moist.     Comments: Last dental exam 04/2021 Eyes:     Conjunctiva/sclera: Conjunctivae normal.  Neck:     Thyroid: No thyroid mass, thyromegaly or thyroid tenderness.  Cardiovascular:     Rate and Rhythm: Normal rate and regular rhythm.  Pulmonary:     Effort: Pulmonary effort is normal.     Breath sounds: Normal breath sounds.  Chest:  Breasts:  Right: Normal.     Left: Normal.  Abdominal:     Palpations: Abdomen is soft.     Comments: Soft, fair tone, without masses or tenderness  Genitourinary:    Comments: Pt refuses bimanual or STD exam; declination form signed Musculoskeletal:        General: Normal range of motion.     Cervical back: Normal range of motion and neck supple.  Skin:    General: Skin is warm and dry.  Neurological:     Mental Status: She is alert.  Psychiatric:        Mood and Affect: Mood normal.       Assessment and Plan:  Maria Gallegos is a 36 y.o. SWF smoker G1P1 (13 yo daughter) female presenting to  the [facility] Department for an initial annual wellness/contraceptive visit  Contraception counseling: Reviewed options based on patient desire and reproductive life plan. Patient is interested in Hormonal Injection. This was provided to the patient today.  if not why not clearly documented  Risks, benefits, and typical effectiveness rates were reviewed.  Questions were answered.  Written information was also given to the patient to review.    The patient will follow up in  11-13 wks  for surveillance.  The patient was told to call with any further questions, or with any concerns about this method of contraception.  Emphasized use of condoms 100% of the time for STI prevention.  Need for ECP was assessed. Patient reported not meeting criteria.  Reviewed options and patient desired No method of ECP, declined all    1. Family planning Counseled via 5 A's to stop smoking Pt declines ETOH cessation assistance for daily use  2. Encounter for surveillance of injectable contraceptive May have DMPA 150 mg IM q 11-13 wks x 1 year     No follow-ups on file.  No future appointments.  Alberteen Spindle, CNM

## 2021-07-19 NOTE — Progress Notes (Signed)
Pt here for PE and Depo.  Depo 150 mg given IM in LUOQ without any complications.  Pt given reminder card to return in 11-13 weeks.  Berdie Ogren, RN

## 2021-10-12 ENCOUNTER — Ambulatory Visit (LOCAL_COMMUNITY_HEALTH_CENTER): Payer: Medicaid Other

## 2021-10-12 ENCOUNTER — Ambulatory Visit: Payer: Medicaid Other

## 2021-10-12 VITALS — BP 136/88 | Ht 69.0 in | Wt 175.0 lb

## 2021-10-12 DIAGNOSIS — Z3009 Encounter for other general counseling and advice on contraception: Secondary | ICD-10-CM

## 2021-10-12 DIAGNOSIS — Z3042 Encounter for surveillance of injectable contraceptive: Secondary | ICD-10-CM | POA: Diagnosis not present

## 2021-10-12 MED ORDER — MEDROXYPROGESTERONE ACETATE 150 MG/ML IM SUSP
150.0000 mg | Freq: Once | INTRAMUSCULAR | Status: AC
  Administered 2021-10-12: 150 mg via INTRAMUSCULAR

## 2021-10-12 NOTE — Progress Notes (Signed)
12 weeks post Depo.  Medroxyprogesterone Acetate 150 mg. Given per order by Ola Spurr, CNM dated 07/19/2021 x 1 year.  Voiced no concerns. Reminder card provided for 12/28/2021.

## 2021-12-12 ENCOUNTER — Inpatient Hospital Stay: Payer: Medicaid Other

## 2021-12-12 ENCOUNTER — Encounter: Payer: Self-pay | Admitting: Oncology

## 2021-12-12 ENCOUNTER — Inpatient Hospital Stay: Payer: Medicaid Other | Attending: Oncology | Admitting: Oncology

## 2021-12-12 VITALS — BP 132/89 | HR 83 | Temp 99.4°F | Resp 16 | Ht 69.0 in | Wt 170.0 lb

## 2021-12-12 DIAGNOSIS — E8729 Other acidosis: Secondary | ICD-10-CM | POA: Insufficient documentation

## 2021-12-12 DIAGNOSIS — D751 Secondary polycythemia: Secondary | ICD-10-CM

## 2021-12-12 DIAGNOSIS — F17210 Nicotine dependence, cigarettes, uncomplicated: Secondary | ICD-10-CM | POA: Diagnosis not present

## 2021-12-12 DIAGNOSIS — Z803 Family history of malignant neoplasm of breast: Secondary | ICD-10-CM | POA: Diagnosis not present

## 2021-12-12 LAB — IRON AND TIBC
Iron: 109 ug/dL (ref 28–170)
Saturation Ratios: 28 % (ref 10.4–31.8)
TIBC: 386 ug/dL (ref 250–450)
UIBC: 277 ug/dL

## 2021-12-12 LAB — CBC
HCT: 57.8 % — ABNORMAL HIGH (ref 36.0–46.0)
Hemoglobin: 19.6 g/dL — ABNORMAL HIGH (ref 12.0–15.0)
MCH: 31.9 pg (ref 26.0–34.0)
MCHC: 33.9 g/dL (ref 30.0–36.0)
MCV: 94 fL (ref 80.0–100.0)
Platelets: 265 10*3/uL (ref 150–400)
RBC: 6.15 MIL/uL — ABNORMAL HIGH (ref 3.87–5.11)
RDW: 12.8 % (ref 11.5–15.5)
WBC: 9.2 10*3/uL (ref 4.0–10.5)
nRBC: 0 % (ref 0.0–0.2)

## 2021-12-12 LAB — FERRITIN: Ferritin: 72 ng/mL (ref 11–307)

## 2021-12-12 NOTE — Progress Notes (Signed)
Maria Gallegos  Telephone:(336) [phone] Fax:(336) [phone]  ID: Maria Gallegos OB: [birth year]  MR#: [id number]  XQJ#:[id number]  Patient Care Team: [facility] as PCP - General  CHIEF COMPLAINT: Polycythemia.  INTERVAL HISTORY: Patient is a 36 year old female who was noted to have an increasing hemoglobin on routine blood work.  She currently feels well.  She has no neurologic complaints.  She denies any recent fevers or illnesses.  She has a good appetite and denies weight loss.  She has no chest pain, shortness of breath, cough, or hemoptysis.  She denies any nausea, vomiting, constipation, or diarrhea.  She has no urinary complaints.  Patient offers no specific complaints today.  REVIEW OF SYSTEMS:   Review of Systems  Constitutional: Negative.  Negative for fever, malaise/fatigue and weight loss.  Respiratory: Negative.  Negative for cough, hemoptysis and shortness of breath.   Cardiovascular: Negative.  Negative for chest pain and leg swelling.  Gastrointestinal: Negative.  Negative for abdominal pain.  Genitourinary: Negative.  Negative for dysuria.  Skin: Negative.  Negative for rash.  Neurological: Negative.  Negative for dizziness, focal weakness, weakness and headaches.  Psychiatric/Behavioral: Negative.  The patient is not nervous/anxious.     As per HPI. Otherwise, a complete review of systems is negative.  PAST MEDICAL HISTORY: Past Medical History:  Diagnosis Date   Anxiety    Depression    GERD (gastroesophageal reflux disease)     PAST SURGICAL HISTORY: Past Surgical History:  Procedure Laterality Date   LEEP     TONSILLECTOMY AND ADENOIDECTOMY  1997    FAMILY HISTORY: Family History  Problem Relation Age of Onset   Breast cancer Paternal Grandmother 75   Alcohol abuse Maternal Grandfather    Endometriosis Sister    Bipolar disorder Sister    Breast cancer Paternal Aunt     ADVANCED DIRECTIVES (Y/N):  N  HEALTH  MAINTENANCE: Social History   Tobacco Use   Smoking status: Every Day    Packs/day: 0.50    Years: 18.00    Total pack years: 9.00    Types: Cigarettes, Cigars   Smokeless tobacco: Never  Vaping Use   Vaping Use: Never used  Substance Use Topics   Alcohol use: Yes    Alcohol/week: 2.0 standard drinks of alcohol    Types: 1 Cans of beer, 1 Shots of liquor per week    Comment: daily use   Drug use: Never     Colonoscopy:  PAP:  Bone density:  Lipid panel:  Allergies  Allergen Reactions   Penicillins Itching and Rash   Vicodin [Hydrocodone-Acetaminophen] Itching and Rash    Current Outpatient Medications  Medication Sig Dispense Refill   Biotin 1 MG CAPS Take by mouth.     busPIRone (BUSPAR) 10 MG tablet Take 10 mg by mouth as needed.     cyanocobalamin 1000 MCG tablet Take by mouth.     DULoxetine (CYMBALTA) 20 MG capsule Take 1 capsule by mouth daily.     DULoxetine (CYMBALTA) 60 MG capsule Take 1 capsule by mouth daily.     Multiple Vitamin (MULTIVITAMIN) capsule Take 1 capsule by mouth daily. 100 capsule 0   Multiple Vitamins-Minerals (MULTIVITAMIN WITH MINERALS) tablet Take 1 tablet by mouth daily. 100 tablet 0   Multiple Vitamins-Minerals (MULTIVITAMIN WITH MINERALS) tablet Take 1 tablet by mouth daily. 100 tablet 0   omeprazole (PRILOSEC) 40 MG capsule Take 40 mg by mouth in the morning and at bedtime.  No current facility-administered medications for this visit.    OBJECTIVE: Vitals:   12/12/21 1048  BP: 132/89  Pulse: 83  Resp: 16  Temp: 99.4 F (37.4 C)  SpO2: 98%     Body mass index is 25.1 kg/m.    ECOG FS:0 - Asymptomatic  General: Well-developed, well-nourished, no acute distress. Eyes: Pink conjunctiva, anicteric sclera. HEENT: Normocephalic, moist mucous membranes. Lungs: No audible wheezing or coughing. Heart: Regular rate and rhythm. Abdomen: Soft, nontender, no obvious distention. Musculoskeletal: No edema, cyanosis, or  clubbing. Neuro: Alert, answering all questions appropriately. Cranial nerves grossly intact. Skin: No rashes or petechiae noted. Psych: Normal affect. Lymphatics: No cervical, calvicular, axillary or inguinal LAD.   LAB RESULTS:  No results found for: "NA", "K", "CL", "CO2", "GLUCOSE", "BUN", "CREATININE", "CALCIUM", "PROT", "ALBUMIN", "AST", "ALT", "ALKPHOS", "BILITOT", "GFRNONAA", "GFRAA"  Lab Results  Component Value Date   WBC 9.2 12/12/2021   HGB 19.6 (H) 12/12/2021   HCT 57.8 (H) 12/12/2021   MCV 94.0 12/12/2021   PLT 265 12/12/2021     STUDIES: No results found.  ASSESSMENT: Polycythemia.  PLAN:    Polycythemia: Upon review of patient's records, her hemoglobin has been persistently elevated and trending up since at least March 2022.  Her most recent result is 19.6.  Iron stores, erythropoietin level, hemochromatosis mutation, JAK2, and carbon monoxide levels are pending at time of dictation.  Either way, patient will benefit from phlebotomy.  Return to clinic in 3 weeks for further evaluation and discussion of her laboratory results, and initiation of phlebotomy.  I spent a total of 45 minutes reviewing chart data, face-to-face evaluation with the patient, counseling and coordination of care as detailed above.   Patient expressed understanding and was in agreement with this plan. She also understands that She can call clinic at any time with any questions, concerns, or complaints.    [name], MD   12/12/2021 3:20 PM

## 2021-12-13 LAB — ERYTHROPOIETIN: Erythropoietin: 3.1 m[IU]/mL (ref 2.6–18.5)

## 2021-12-14 LAB — CARBON MONOXIDE, BLOOD (PERFORMED AT REF LAB): Carbon Monoxide, Blood: 4.5 % — ABNORMAL HIGH (ref 0.0–3.6)

## 2021-12-19 ENCOUNTER — Encounter: Payer: Self-pay | Admitting: Oncology

## 2021-12-19 LAB — JAK2 V617F RFX CALR/MPL/E12-15

## 2021-12-19 LAB — CALR +MPL + E12-E15  (REFLEX)

## 2021-12-19 LAB — HEMOCHROMATOSIS DNA-PCR(C282Y,H63D)

## 2022-01-01 ENCOUNTER — Encounter: Payer: Self-pay | Admitting: Oncology

## 2022-01-01 ENCOUNTER — Inpatient Hospital Stay (HOSPITAL_BASED_OUTPATIENT_CLINIC_OR_DEPARTMENT_OTHER): Payer: Medicaid Other | Admitting: Oncology

## 2022-01-01 ENCOUNTER — Inpatient Hospital Stay: Payer: Medicaid Other

## 2022-01-01 VITALS — BP 120/80 | HR 98 | Temp 98.9°F | Resp 16 | Ht 69.0 in | Wt 171.2 lb

## 2022-01-01 VITALS — BP 115/75 | HR 76

## 2022-01-01 DIAGNOSIS — D751 Secondary polycythemia: Secondary | ICD-10-CM | POA: Diagnosis not present

## 2022-01-01 NOTE — Patient Instructions (Signed)

## 2022-01-01 NOTE — Progress Notes (Signed)
[place]  Telephone:(336) [phone] Fax:(336) [phone]  ID: Thurston Hole OB: 1985/04/29  MR#: [id number]  [id number]  Patient Care Team: [facility] as PCP - General  CHIEF COMPLAINT: Polycythemia.  INTERVAL HISTORY: Patient returns to clinic today for further evaluation, discussed for laboratory work, and initiation of phlebotomy.  She continues to feel well and remains asymptomatic.  She has no neurologic complaints.  She denies any recent fevers or illnesses.  She has a good appetite and denies weight loss.  She has no chest pain, shortness of breath, cough, or hemoptysis.  She denies any nausea, vomiting, constipation, or diarrhea.  She has no urinary complaints.  Patient offers no specific complaints today.  REVIEW OF SYSTEMS:   Review of Systems  Constitutional: Negative.  Negative for fever, malaise/fatigue and weight loss.  Respiratory: Negative.  Negative for cough, hemoptysis and shortness of breath.   Cardiovascular: Negative.  Negative for chest pain and leg swelling.  Gastrointestinal: Negative.  Negative for abdominal pain.  Genitourinary: Negative.  Negative for dysuria.  Skin: Negative.  Negative for rash.  Neurological: Negative.  Negative for dizziness, focal weakness, weakness and headaches.  Psychiatric/Behavioral: Negative.  The patient is not nervous/anxious.     As per HPI. Otherwise, a complete review of systems is negative.  PAST MEDICAL HISTORY: Past Medical History:  Diagnosis Date   Anxiety    Depression    GERD (gastroesophageal reflux disease)     PAST SURGICAL HISTORY: Past Surgical History:  Procedure Laterality Date   LEEP     TONSILLECTOMY AND ADENOIDECTOMY  1997    FAMILY HISTORY: Family History  Problem Relation Age of Onset   Breast cancer Paternal Grandmother 26   Alcohol abuse Maternal Grandfather    Endometriosis Sister    Bipolar disorder Sister    Breast cancer Paternal Aunt     ADVANCED  DIRECTIVES (Y/N):  N  HEALTH MAINTENANCE: Social History   Tobacco Use   Smoking status: Every Day    Packs/day: 0.50    Years: 18.00    Total pack years: 9.00    Types: Cigarettes, Cigars   Smokeless tobacco: Never  Vaping Use   Vaping Use: Never used  Substance Use Topics   Alcohol use: Yes    Alcohol/week: 2.0 standard drinks of alcohol    Types: 1 Cans of beer, 1 Shots of liquor per week    Comment: daily use   Drug use: Never     Colonoscopy:  PAP:  Bone density:  Lipid panel:  Allergies  Allergen Reactions   Penicillins Itching and Rash   Vicodin [Hydrocodone-Acetaminophen] Itching and Rash    Current Outpatient Medications  Medication Sig Dispense Refill   Biotin 1 MG CAPS Take by mouth.     busPIRone (BUSPAR) 10 MG tablet Take 10 mg by mouth as needed.     cyanocobalamin 1000 MCG tablet Take by mouth.     DULoxetine (CYMBALTA) 20 MG capsule Take 1 capsule by mouth daily.     DULoxetine (CYMBALTA) 60 MG capsule Take 1 capsule by mouth daily.     Multiple Vitamin (MULTIVITAMIN) capsule Take 1 capsule by mouth daily. 100 capsule 0   Multiple Vitamins-Minerals (MULTIVITAMIN WITH MINERALS) tablet Take 1 tablet by mouth daily. 100 tablet 0   Multiple Vitamins-Minerals (MULTIVITAMIN WITH MINERALS) tablet Take 1 tablet by mouth daily. 100 tablet 0   omeprazole (PRILOSEC) 40 MG capsule Take 40 mg by mouth in the morning and at bedtime.  No current facility-administered medications for this visit.    OBJECTIVE: Vitals:   01/01/22 1456  BP: 120/80  Pulse: 98  Resp: 16  Temp: 98.9 F (37.2 C)  SpO2: 98%     Body mass index is 25.28 kg/m.    ECOG FS:0 - Asymptomatic  General: Well-developed, well-nourished, no acute distress. Eyes: Pink conjunctiva, anicteric sclera. HEENT: Normocephalic, moist mucous membranes. Lungs: No audible wheezing or coughing. Heart: Regular rate and rhythm. Abdomen: Soft, nontender, no obvious distention. Musculoskeletal: No  edema, cyanosis, or clubbing. Neuro: Alert, answering all questions appropriately. Cranial nerves grossly intact. Skin: No rashes or petechiae noted. Psych: Normal affect.   LAB RESULTS:  No results found for: "NA", "K", "CL", "CO2", "GLUCOSE", "BUN", "CREATININE", "CALCIUM", "PROT", "ALBUMIN", "AST", "ALT", "ALKPHOS", "BILITOT", "GFRNONAA", "GFRAA"  Lab Results  Component Value Date   WBC 9.2 12/12/2021   HGB 19.6 (H) 12/12/2021   HCT 57.8 (H) 12/12/2021   MCV 94.0 12/12/2021   PLT 265 12/12/2021   Lab Results  Component Value Date   IRON 109 12/12/2021   TIBC 386 12/12/2021   IRONPCTSAT 28 12/12/2021   Lab Results  Component Value Date   FERRITIN 72 12/12/2021     STUDIES: No results found.  ASSESSMENT: Polycythemia.  PLAN:    Polycythemia: Upon review of patient's records, her hemoglobin has been persistently elevated and trending up since at least March 2022.  Her most recent result is 19.6.  Iron stores, erythropoietin level, hemochromatosis mutation, JAK2, CALR, MPL, and E12-15 mutations are all negative.  Carbon monoxide levels are only mildly elevated at 4.5.  Proceed with 500 mL phlebotomy today.  Return to clinic in 6 weeks for repeat laboratory work, further evaluation, and additional phlebotomy if needed.   I spent a total of 30 minutes reviewing chart data, face-to-face evaluation with the patient, counseling and coordination of care as detailed above.   Patient expressed understanding and was in agreement with this plan. She also understands that She can call clinic at any time with any questions, concerns, or complaints.    [name], MD   01/01/2022 3:15 PM

## 2022-01-02 ENCOUNTER — Ambulatory Visit: Payer: Medicaid Other | Admitting: Oncology

## 2022-01-02 ENCOUNTER — Encounter: Payer: Self-pay | Admitting: Oncology

## 2022-01-03 ENCOUNTER — Encounter: Payer: Self-pay | Admitting: Oncology

## 2022-01-10 ENCOUNTER — Ambulatory Visit (LOCAL_COMMUNITY_HEALTH_CENTER): Payer: Medicaid Other

## 2022-01-10 VITALS — BP 117/70 | Ht 69.0 in | Wt 174.5 lb

## 2022-01-10 DIAGNOSIS — Z309 Encounter for contraceptive management, unspecified: Secondary | ICD-10-CM

## 2022-01-10 DIAGNOSIS — Z3009 Encounter for other general counseling and advice on contraception: Secondary | ICD-10-CM

## 2022-01-10 DIAGNOSIS — Z3042 Encounter for surveillance of injectable contraceptive: Secondary | ICD-10-CM | POA: Diagnosis not present

## 2022-01-10 MED ORDER — MEDROXYPROGESTERONE ACETATE 150 MG/ML IM SUSP
150.0000 mg | Freq: Once | INTRAMUSCULAR | Status: AC
  Administered 2022-01-10: 150 mg via INTRAMUSCULAR

## 2022-01-10 NOTE — Progress Notes (Signed)
12 weeks 6 days post Depo.  Medroxyprogesterone Acetate 150 mg given per order by Ola Spurr, CNM dated 07/19/2021. Given in [facility].  Tolerated well. Reminder card provided to call for next appointment due 03/28/2022.

## 2022-02-12 ENCOUNTER — Inpatient Hospital Stay: Payer: Medicaid Other

## 2022-02-12 ENCOUNTER — Encounter: Payer: Self-pay | Admitting: Oncology

## 2022-02-12 ENCOUNTER — Inpatient Hospital Stay: Payer: Medicaid Other | Attending: Oncology

## 2022-02-12 ENCOUNTER — Inpatient Hospital Stay: Payer: Medicaid Other | Admitting: Oncology

## 2022-02-12 VITALS — BP 132/90 | HR 86 | Resp 20

## 2022-02-12 VITALS — BP 133/92 | HR 80 | Temp 98.7°F | Resp 18 | Wt 175.4 lb

## 2022-02-12 DIAGNOSIS — D751 Secondary polycythemia: Secondary | ICD-10-CM | POA: Diagnosis present

## 2022-02-12 DIAGNOSIS — F17210 Nicotine dependence, cigarettes, uncomplicated: Secondary | ICD-10-CM | POA: Insufficient documentation

## 2022-02-12 DIAGNOSIS — Z803 Family history of malignant neoplasm of breast: Secondary | ICD-10-CM | POA: Diagnosis not present

## 2022-02-12 LAB — CBC WITH DIFFERENTIAL/PLATELET
Abs Immature Granulocytes: 0.04 10*3/uL (ref 0.00–0.07)
Basophils Absolute: 0.1 10*3/uL (ref 0.0–0.1)
Basophils Relative: 1 %
Eosinophils Absolute: 0.2 10*3/uL (ref 0.0–0.5)
Eosinophils Relative: 2 %
HCT: 50.1 % — ABNORMAL HIGH (ref 36.0–46.0)
Hemoglobin: 16.9 g/dL — ABNORMAL HIGH (ref 12.0–15.0)
Immature Granulocytes: 0 %
Lymphocytes Relative: 19 %
Lymphs Abs: 1.9 10*3/uL (ref 0.7–4.0)
MCH: 32.9 pg (ref 26.0–34.0)
MCHC: 33.7 g/dL (ref 30.0–36.0)
MCV: 97.7 fL (ref 80.0–100.0)
Monocytes Absolute: 0.6 10*3/uL (ref 0.1–1.0)
Monocytes Relative: 6 %
Neutro Abs: 7.5 10*3/uL (ref 1.7–7.7)
Neutrophils Relative %: 72 %
Platelets: 251 10*3/uL (ref 150–400)
RBC: 5.13 MIL/uL — ABNORMAL HIGH (ref 3.87–5.11)
RDW: 13.2 % (ref 11.5–15.5)
WBC: 10.3 10*3/uL (ref 4.0–10.5)
nRBC: 0 % (ref 0.0–0.2)

## 2022-02-12 NOTE — Patient Instructions (Signed)

## 2022-02-12 NOTE — Progress Notes (Signed)
[facility]  Telephone:(336) [phone] Fax:(336) [phone]  ID: Thurston Hole OB: Jun 02, 1985  MR#: [id number]  ALP#:[id number]  Patient Care Team: [facility] as P[facility] - General  CHIEF COMPLAINT: Polycythemia.  INTERVAL HISTORY: Patient returns to clinic today for repeat laboratory work, further evaluation, and consideration of additional phlebotomy.  She continues to feel well and remains asymptomatic.  She has no neurologic complaints.  She denies any recent fevers or illnesses.  She has a good appetite and denies weight loss.  She has no chest pain, shortness of breath, cough, or hemoptysis.  She denies any nausea, vomiting, constipation, or diarrhea.  She has no urinary complaints.  Patient offers no specific complaints today.  REVIEW OF SYSTEMS:   Review of Systems  Constitutional: Negative.  Negative for fever, malaise/fatigue and weight loss.  Respiratory: Negative.  Negative for cough, hemoptysis and shortness of breath.   Cardiovascular: Negative.  Negative for chest pain and leg swelling.  Gastrointestinal: Negative.  Negative for abdominal pain.  Genitourinary: Negative.  Negative for dysuria.  Skin: Negative.  Negative for rash.  Neurological: Negative.  Negative for dizziness, focal weakness, weakness and headaches.  Psychiatric/Behavioral: Negative.  The patient is not nervous/anxious.     As per HPI. Otherwise, a complete review of systems is negative.  PAST MEDICAL HISTORY: Past Medical History:  Diagnosis Date   Anxiety    Depression    GERD (gastroesophageal reflux disease)     PAST SURGICAL HISTORY: Past Surgical History:  Procedure Laterality Date   LEEP     TONSILLECTOMY AND ADENOIDECTOMY  1997    FAMILY HISTORY: Family History  Problem Relation Age of Onset   Breast cancer Paternal Grandmother 57   Alcohol abuse Maternal Grandfather    Endometriosis Sister    Bipolar disorder Sister    Breast cancer Paternal Aunt      ADVANCED DIRECTIVES (Y/N):  N  HEALTH MAINTENANCE: Social History   Tobacco Use   Smoking status: Every Day    Packs/day: 0.50    Years: 18.00    Total pack years: 9.00    Types: Cigarettes, Cigars   Smokeless tobacco: Never  Vaping Use   Vaping Use: Never used  Substance Use Topics   Alcohol use: Yes    Alcohol/week: 2.0 standard drinks of alcohol    Types: 1 Cans of beer, 1 Shots of liquor per week    Comment: daily use   Drug use: Never     Colonoscopy:  PAP:  Bone density:  Lipid panel:  Allergies  Allergen Reactions   Penicillins Itching and Rash   Vicodin [Hydrocodone-Acetaminophen] Itching and Rash    Current Outpatient Medications  Medication Sig Dispense Refill   Biotin 1 MG CAPS Take by mouth.     busPIRone (BUSPAR) 10 MG tablet Take 10 mg by mouth as needed.     cyanocobalamin 1000 MCG tablet Take by mouth.     DULoxetine (CYMBALTA) 20 MG capsule Take 1 capsule by mouth daily.     DULoxetine (CYMBALTA) 60 MG capsule Take 1 capsule by mouth daily.     Multiple Vitamin (MULTIVITAMIN) capsule Take 1 capsule by mouth daily. 100 capsule 0   Multiple Vitamins-Minerals (MULTIVITAMIN WITH MINERALS) tablet Take 1 tablet by mouth daily. 100 tablet 0   Multiple Vitamins-Minerals (MULTIVITAMIN WITH MINERALS) tablet Take 1 tablet by mouth daily. 100 tablet 0   omeprazole (PRILOSEC) 40 MG capsule Take 40 mg by mouth in the morning and at bedtime.  No current facility-administered medications for this visit.    OBJECTIVE: Vitals:   02/12/22 1335  BP: (!) 133/92  Pulse: 80  Resp: 18  Temp: 98.7 F (37.1 C)     Body mass index is 25.9 kg/m.    ECOG FS:0 - Asymptomatic  General: Well-developed, well-nourished, no acute distress. Eyes: Pink conjunctiva, anicteric sclera. HEENT: Normocephalic, moist mucous membranes. Lungs: No audible wheezing or coughing. Heart: Regular rate and rhythm. Abdomen: Soft, nontender, no obvious distention. Musculoskeletal:  No edema, cyanosis, or clubbing. Neuro: Alert, answering all questions appropriately. Cranial nerves grossly intact. Skin: No rashes or petechiae noted. Psych: Normal affect.  LAB RESULTS:  No results found for: "NA", "K", "CL", "CO2", "GLUCOSE", "BUN", "CREATININE", "CALCIUM", "PROT", "ALBUMIN", "AST", "ALT", "ALKPHOS", "BILITOT", "GFRNONAA", "GFRAA"  Lab Results  Component Value Date   WBC 10.3 02/12/2022   NEUTROABS 7.5 02/12/2022   HGB 16.9 (H) 02/12/2022   HCT 50.1 (H) 02/12/2022   MCV 97.7 02/12/2022   PLT 251 02/12/2022   Lab Results  Component Value Date   IRON 109 12/12/2021   TIBC 386 12/12/2021   IRONPCTSAT 28 12/12/2021   Lab Results  Component Value Date   FERRITIN 72 12/12/2021     STUDIES: No results found.  ASSESSMENT: Polycythemia.  PLAN:    Polycythemia: Upon review of patient's records, her hemoglobin has been persistently elevated and trending up since at least March 2022.  Iron stores, erythropoietin level, hemochromatosis mutation, JAK2, CALR, MPL, and E12-15 mutations are all negative.  Carbon monoxide levels are only mildly elevated at 4.5.  Patient's hemoglobin improved to 16.9 with 500 mL phlebotomy.  Proceed with a second treatment today.  Return to clinic in 3 months with repeat laboratory work, further evaluation and continuation of phlebotomy if needed.    I spent a total of 20 minutes reviewing chart data, face-to-face evaluation with the patient, counseling and coordination of care as detailed above.   Patient expressed understanding and was in agreement with this plan. She also understands that She can call clinic at any time with any questions, concerns, or complaints.    Lloyd Huger, MD   02/13/2022 3:24 PM

## 2022-02-12 NOTE — Progress Notes (Signed)
Patient denies any concerns today.  

## 2022-02-13 ENCOUNTER — Encounter: Payer: Self-pay | Admitting: Oncology

## 2022-04-12 ENCOUNTER — Ambulatory Visit (LOCAL_COMMUNITY_HEALTH_CENTER): Payer: Medicaid Other

## 2022-04-12 VITALS — BP 133/80 | Ht 69.0 in | Wt 177.0 lb

## 2022-04-12 DIAGNOSIS — Z30013 Encounter for initial prescription of injectable contraceptive: Secondary | ICD-10-CM

## 2022-04-12 DIAGNOSIS — Z3009 Encounter for other general counseling and advice on contraception: Secondary | ICD-10-CM

## 2022-04-12 DIAGNOSIS — Z309 Encounter for contraceptive management, unspecified: Secondary | ICD-10-CM

## 2022-04-12 DIAGNOSIS — Z3042 Encounter for surveillance of injectable contraceptive: Secondary | ICD-10-CM

## 2022-04-12 MED ORDER — MEDROXYPROGESTERONE ACETATE 150 MG/ML IM SUSP
150.0000 mg | Freq: Once | INTRAMUSCULAR | Status: AC
Start: 2022-04-12 — End: 2022-04-12
  Administered 2022-04-12: 150 mg via INTRAMUSCULAR

## 2022-04-12 NOTE — Progress Notes (Signed)
Client 13 weeks and one day post depo.  Voices no concerns.  Depo given today per order by E. Sciora dated 07/19/2021.  Tolerated well in [facility].  Next Depo Due 06/28/2022, PE due 07/19/2021. Client has reminder card.  Maria Carlin Sherrilyn Rist, RN

## 2022-04-30 ENCOUNTER — Ambulatory Visit: Payer: Medicaid Other

## 2022-05-14 ENCOUNTER — Encounter: Payer: Self-pay | Admitting: Oncology

## 2022-05-14 ENCOUNTER — Inpatient Hospital Stay: Payer: Medicaid Other | Admitting: Oncology

## 2022-05-14 ENCOUNTER — Inpatient Hospital Stay: Payer: Medicaid Other

## 2022-05-14 ENCOUNTER — Inpatient Hospital Stay: Payer: Medicaid Other | Attending: Oncology

## 2022-05-14 VITALS — BP 110/73 | HR 88 | Resp 20

## 2022-05-14 VITALS — BP 145/95 | HR 109 | Temp 98.0°F | Wt 176.0 lb

## 2022-05-14 DIAGNOSIS — F17210 Nicotine dependence, cigarettes, uncomplicated: Secondary | ICD-10-CM | POA: Diagnosis not present

## 2022-05-14 DIAGNOSIS — I10 Essential (primary) hypertension: Secondary | ICD-10-CM | POA: Diagnosis not present

## 2022-05-14 DIAGNOSIS — R5383 Other fatigue: Secondary | ICD-10-CM | POA: Diagnosis not present

## 2022-05-14 DIAGNOSIS — D751 Secondary polycythemia: Secondary | ICD-10-CM | POA: Diagnosis not present

## 2022-05-14 DIAGNOSIS — Z803 Family history of malignant neoplasm of breast: Secondary | ICD-10-CM | POA: Insufficient documentation

## 2022-05-14 LAB — CBC WITH DIFFERENTIAL/PLATELET
Abs Immature Granulocytes: 0.03 10*3/uL (ref 0.00–0.07)
Basophils Absolute: 0.1 10*3/uL (ref 0.0–0.1)
Basophils Relative: 1 %
Eosinophils Absolute: 0.3 10*3/uL (ref 0.0–0.5)
Eosinophils Relative: 4 %
HCT: 51.7 % — ABNORMAL HIGH (ref 36.0–46.0)
Hemoglobin: 17.1 g/dL — ABNORMAL HIGH (ref 12.0–15.0)
Immature Granulocytes: 0 %
Lymphocytes Relative: 28 %
Lymphs Abs: 2.3 10*3/uL (ref 0.7–4.0)
MCH: 31.3 pg (ref 26.0–34.0)
MCHC: 33.1 g/dL (ref 30.0–36.0)
MCV: 94.5 fL (ref 80.0–100.0)
Monocytes Absolute: 0.6 10*3/uL (ref 0.1–1.0)
Monocytes Relative: 7 %
Neutro Abs: 5.1 10*3/uL (ref 1.7–7.7)
Neutrophils Relative %: 60 %
Platelets: 242 10*3/uL (ref 150–400)
RBC: 5.47 MIL/uL — ABNORMAL HIGH (ref 3.87–5.11)
RDW: 14.1 % (ref 11.5–15.5)
WBC: 8.4 10*3/uL (ref 4.0–10.5)
nRBC: 0 % (ref 0.0–0.2)

## 2022-05-14 NOTE — Progress Notes (Signed)
Pt. Fatigue, light headedness, two weeks ago pt. Got up from sitting and felt dizzy, and felt like she was going to pass out. Trouble sleeping while having night sweats. Bowel issues, ongoing for a while has had not diarrhea but somewhere in between.

## 2022-05-14 NOTE — Progress Notes (Signed)
[facility]  Telephone:(336) [phone] Fax:(336) [phone]  ID: Maria Gallegos OB: 1985-04-30  MR#: [id number]  AOZ#:[id number]  Patient Care Team: [facility] as PCP - General  CHIEF COMPLAINT: Polycythemia.  INTERVAL HISTORY: Patient returns to clinic today for repeat laboratory work, further evaluation, and consideration of additional phlebotomy.  She admits to increased fatigue and diarrhea, but states these are chronic and unchanged.  She otherwise feels well.  She has no neurologic complaints.  She denies any recent fevers or illnesses.  She has a good appetite and denies weight loss.  She has no chest pain, shortness of breath, cough, or hemoptysis.  She denies any nausea, vomiting, or constipation.  She has no urinary complaints.  Patient offers no further specific complaints today.  REVIEW OF SYSTEMS:   Review of Systems  Constitutional:  Positive for malaise/fatigue. Negative for fever and weight loss.  Respiratory: Negative.  Negative for cough, hemoptysis and shortness of breath.   Cardiovascular: Negative.  Negative for chest pain and leg swelling.  Gastrointestinal:  Positive for diarrhea. Negative for abdominal pain.  Genitourinary: Negative.  Negative for dysuria.  Skin: Negative.  Negative for rash.  Neurological: Negative.  Negative for dizziness, focal weakness, weakness and headaches.  Psychiatric/Behavioral: Negative.  The patient is not nervous/anxious.     As per HPI. Otherwise, a complete review of systems is negative.  PAST MEDICAL HISTORY: Past Medical History:  Diagnosis Date   Anxiety    Depression    GERD (gastroesophageal reflux disease)     PAST SURGICAL HISTORY: Past Surgical History:  Procedure Laterality Date   LEEP     TONSILLECTOMY AND ADENOIDECTOMY  1997    FAMILY HISTORY: Family History  Problem Relation Age of Onset   Breast cancer Paternal Grandmother 86   Alcohol abuse Maternal Grandfather    Endometriosis  Sister    Bipolar disorder Sister    Breast cancer Paternal Aunt     ADVANCED DIRECTIVES (Y/N):  N  HEALTH MAINTENANCE: Social History   Tobacco Use   Smoking status: Every Day    Packs/day: 0.50    Years: 18.00    Additional pack years: 0.00    Total pack years: 9.00    Types: Cigarettes, Cigars   Smokeless tobacco: Never  Vaping Use   Vaping Use: Never used  Substance Use Topics   Alcohol use: Yes    Alcohol/week: 2.0 standard drinks of alcohol    Types: 1 Cans of beer, 1 Shots of liquor per week    Comment: daily use   Drug use: Never     Colonoscopy:  PAP:  Bone density:  Lipid panel:  Allergies  Allergen Reactions   Penicillins Itching and Rash   Vicodin [Hydrocodone-Acetaminophen] Itching and Rash    Current Outpatient Medications  Medication Sig Dispense Refill   busPIRone (BUSPAR) 10 MG tablet Take 10 mg by mouth as needed.     dicyclomine (BENTYL) 20 MG tablet Take 20 mg by mouth 4 (four) times daily.     DULoxetine (CYMBALTA) 60 MG capsule Take 1 capsule by mouth daily.     Multiple Vitamins-Minerals (MULTIVITAMIN WITH MINERALS) tablet Take 1 tablet by mouth daily. 100 tablet 0   omeprazole (PRILOSEC) 40 MG capsule Take 40 mg by mouth in the morning and at bedtime.     Biotin 1 MG CAPS Take by mouth. (Patient not taking: Reported on 05/14/2022)     cyanocobalamin 1000 MCG tablet Take by mouth. (Patient not taking:  Reported on 05/14/2022)     Multiple Vitamin (MULTIVITAMIN) capsule Take 1 capsule by mouth daily. (Patient not taking: Reported on 05/14/2022) 100 capsule 0   Multiple Vitamins-Minerals (MULTIVITAMIN WITH MINERALS) tablet Take 1 tablet by mouth daily. (Patient not taking: Reported on 05/14/2022) 100 tablet 0   No current facility-administered medications for this visit.    OBJECTIVE: Vitals:   05/14/22 1424  BP: (!) 145/95  Pulse: (!) 109  Temp: 98 F (36.7 C)  SpO2: 100%     Body mass index is 25.99 kg/m.    ECOG FS:0 -  Asymptomatic  General: Well-developed, well-nourished, no acute distress. Eyes: Pink conjunctiva, anicteric sclera. HEENT: Normocephalic, moist mucous membranes. Lungs: No audible wheezing or coughing. Heart: Regular rate and rhythm. Abdomen: Soft, nontender, no obvious distention. Musculoskeletal: No edema, cyanosis, or clubbing. Neuro: Alert, answering all questions appropriately. Cranial nerves grossly intact. Skin: No rashes or petechiae noted. Psych: Normal affect.  LAB RESULTS:  No results found for: "NA", "K", "CL", "CO2", "GLUCOSE", "BUN", "CREATININE", "CALCIUM", "PROT", "ALBUMIN", "AST", "ALT", "ALKPHOS", "BILITOT", "GFRNONAA", "GFRAA"  Lab Results  Component Value Date   WBC 8.4 05/14/2022   NEUTROABS 5.1 05/14/2022   HGB 17.1 (H) 05/14/2022   HCT 51.7 (H) 05/14/2022   MCV 94.5 05/14/2022   PLT 242 05/14/2022   Lab Results  Component Value Date   IRON 109 12/12/2021   TIBC 386 12/12/2021   IRONPCTSAT 28 12/12/2021   Lab Results  Component Value Date   FERRITIN 72 12/12/2021     STUDIES: No results found.  ASSESSMENT: Polycythemia.  PLAN:    Polycythemia: Upon review of patient's records, her hemoglobin has been persistently elevated and trending up since at least March 2022.  Iron stores, erythropoietin level, hemochromatosis mutation, JAK2, CALR, MPL, and E12-15 mutations are all negative.  Carbon monoxide levels are only mildly elevated at 4.5.  Patient's hemoglobin is 17.1 today.  Proceed with 500 mL phlebotomy.  Return to clinic in 3 months with repeat laboratory work, further evaluation, and continuation of treatment if needed.    Fatigue/GI symptoms: Continue follow-up with primary care at [facility] as scheduled.    Hypertension: Patient's blood pressure is moderately elevated today.  Continue monitoring and treatment per primary care.   Patient expressed understanding and was in agreement with this plan. She also understands that She can call clinic at  any time with any questions, concerns, or complaints.    [name], MD   05/14/2022 2:58 PM

## 2022-05-14 NOTE — Patient Instructions (Signed)

## 2022-05-22 ENCOUNTER — Encounter: Payer: Self-pay | Admitting: Oncology

## 2022-07-30 ENCOUNTER — Telehealth: Payer: Self-pay | Admitting: Family Medicine

## 2022-07-30 NOTE — Telephone Encounter (Signed)
Pt is late for her DEPO, I schedule her PE appt and was trying to get her in tomorrow for her injection before her window closes; but she wants an appt on Friday which is already too late.  She states that she is not sexually active currently. Please call to determine if she could wait for her DEPO to be administered. Thanks

## 2022-08-02 ENCOUNTER — Ambulatory Visit: Payer: Medicaid Other

## 2022-08-02 VITALS — BP 120/86 | Wt 178.5 lb

## 2022-08-02 DIAGNOSIS — Z3042 Encounter for surveillance of injectable contraceptive: Secondary | ICD-10-CM | POA: Diagnosis not present

## 2022-08-02 DIAGNOSIS — Z309 Encounter for contraceptive management, unspecified: Secondary | ICD-10-CM

## 2022-08-02 DIAGNOSIS — Z3009 Encounter for other general counseling and advice on contraception: Secondary | ICD-10-CM

## 2022-08-02 MED ORDER — MEDROXYPROGESTERONE ACETATE 150 MG/ML IM SUSP
150.0000 mg | Freq: Once | INTRAMUSCULAR | Status: AC
Start: 2022-08-02 — End: 2022-08-02
  Administered 2022-08-02: 150 mg via INTRAMUSCULAR

## 2022-08-02 NOTE — Addendum Note (Signed)
Addended byKateri Plummer on: 08/02/2022 09:20 AM   Modules accepted: Orders

## 2022-08-02 NOTE — Progress Notes (Signed)
16wks 0days . Voices no concerns. Depo given today per S.O. K.Alvester Morin, MD . Tolerated well L delt. Next depo due 10/18/22. Annual PE appt 08/23/22. Reminder card given.

## 2022-08-21 ENCOUNTER — Other Ambulatory Visit: Payer: Self-pay

## 2022-08-21 DIAGNOSIS — D751 Secondary polycythemia: Secondary | ICD-10-CM

## 2022-08-22 ENCOUNTER — Inpatient Hospital Stay: Payer: Medicaid Other

## 2022-08-22 ENCOUNTER — Inpatient Hospital Stay (HOSPITAL_BASED_OUTPATIENT_CLINIC_OR_DEPARTMENT_OTHER): Payer: Medicaid Other | Admitting: Oncology

## 2022-08-22 ENCOUNTER — Encounter: Payer: Self-pay | Admitting: Oncology

## 2022-08-22 ENCOUNTER — Inpatient Hospital Stay: Payer: Medicaid Other | Attending: Oncology

## 2022-08-22 VITALS — BP 127/95 | HR 100 | Temp 99.3°F | Resp 16 | Ht 69.0 in | Wt 175.8 lb

## 2022-08-22 DIAGNOSIS — I10 Essential (primary) hypertension: Secondary | ICD-10-CM | POA: Insufficient documentation

## 2022-08-22 DIAGNOSIS — D751 Secondary polycythemia: Secondary | ICD-10-CM

## 2022-08-22 DIAGNOSIS — R5383 Other fatigue: Secondary | ICD-10-CM | POA: Diagnosis not present

## 2022-08-22 DIAGNOSIS — Z803 Family history of malignant neoplasm of breast: Secondary | ICD-10-CM | POA: Insufficient documentation

## 2022-08-22 DIAGNOSIS — F17210 Nicotine dependence, cigarettes, uncomplicated: Secondary | ICD-10-CM | POA: Diagnosis not present

## 2022-08-22 LAB — CBC WITH DIFFERENTIAL (CANCER CENTER ONLY)
Abs Immature Granulocytes: 0.12 10*3/uL — ABNORMAL HIGH (ref 0.00–0.07)
Basophils Absolute: 0.1 10*3/uL (ref 0.0–0.1)
Basophils Relative: 1 %
Eosinophils Absolute: 0.3 10*3/uL (ref 0.0–0.5)
Eosinophils Relative: 3 %
HCT: 50.1 % — ABNORMAL HIGH (ref 36.0–46.0)
Hemoglobin: 16.5 g/dL — ABNORMAL HIGH (ref 12.0–15.0)
Immature Granulocytes: 1 %
Lymphocytes Relative: 24 %
Lymphs Abs: 2.4 10*3/uL (ref 0.7–4.0)
MCH: 30.8 pg (ref 26.0–34.0)
MCHC: 32.9 g/dL (ref 30.0–36.0)
MCV: 93.6 fL (ref 80.0–100.0)
Monocytes Absolute: 0.7 10*3/uL (ref 0.1–1.0)
Monocytes Relative: 7 %
Neutro Abs: 6.6 10*3/uL (ref 1.7–7.7)
Neutrophils Relative %: 64 %
Platelet Count: 327 10*3/uL (ref 150–400)
RBC: 5.35 MIL/uL — ABNORMAL HIGH (ref 3.87–5.11)
RDW: 13.3 % (ref 11.5–15.5)
WBC Count: 10.1 10*3/uL (ref 4.0–10.5)
nRBC: 0 % (ref 0.0–0.2)

## 2022-08-22 MED ORDER — ONDANSETRON HCL 4 MG PO TABS: 4.0000 mg | ORAL_TABLET | ORAL | 0 refills | Status: DC | PRN

## 2022-08-22 NOTE — Progress Notes (Signed)
[facility]  [phone] [phone]  ID: Maria Gallegos OB: 1985-09-25  MR#: [id number]  QIO#:[id number]  Patient Care Team: [facility] as PCP - General  CHIEF COMPLAINT: Polycythemia.  INTERVAL HISTORY: Patient returns to clinic today for repeat laboratory work, further evaluation, and consideration of additional phlebotomy.  She continues to have chronic weakness and fatigue, but otherwise feels well.  She does not complain of diarrhea today.  She has no neurologic complaints.  She denies any recent fevers or illnesses.  She has a good appetite and denies weight loss.  She has no chest pain, shortness of breath, cough, or hemoptysis.  She denies any nausea, vomiting, or constipation.  She has no urinary complaints.  Patient offers no further specific complaints today.  REVIEW OF SYSTEMS:   Review of Systems  Constitutional:  Positive for malaise/fatigue. Negative for fever and weight loss.  Respiratory: Negative.  Negative for cough, hemoptysis and shortness of breath.   Cardiovascular: Negative.  Negative for chest pain and leg swelling.  Gastrointestinal: Negative.  Negative for abdominal pain and diarrhea.  Genitourinary: Negative.  Negative for dysuria.  Skin: Negative.  Negative for rash.  Neurological: Negative.  Negative for dizziness, focal weakness, weakness and headaches.  Psychiatric/Behavioral: Negative.  The patient is not nervous/anxious.     As per HPI. Otherwise, a complete review of systems is negative.  PAST MEDICAL HISTORY: Past Medical History:  Diagnosis Date   Anxiety    Depression    GERD (gastroesophageal reflux disease)     PAST SURGICAL HISTORY: Past Surgical History:  Procedure Laterality Date   LEEP     TONSILLECTOMY AND ADENOIDECTOMY  1997    FAMILY HISTORY: Family History  Problem Relation Age of Onset   Breast cancer Paternal Grandmother 23   Alcohol abuse Maternal Grandfather     Endometriosis Sister    Bipolar disorder Sister    Breast cancer Paternal Aunt     ADVANCED DIRECTIVES (Y/N):  N  HEALTH MAINTENANCE: Social History   Tobacco Use   Smoking status: Every Day    Current packs/day: 0.50    Average packs/day: 0.5 packs/day for 18.0 years (9.0 ttl pk-yrs)    Types: Cigarettes, Cigars   Smokeless tobacco: Never  Vaping Use   Vaping status: Never Used  Substance Use Topics   Alcohol use: Yes    Alcohol/week: 2.0 standard drinks of alcohol    Types: 1 Cans of beer, 1 Shots of liquor per week    Comment: daily use   Drug use: Never     Colonoscopy:  PAP:  Bone density:  Lipid panel:  Allergies  Allergen Reactions   Penicillins Itching and Rash   Vicodin [Hydrocodone-Acetaminophen] Itching and Rash    Current Outpatient Medications  Medication Sig Dispense Refill   busPIRone (BUSPAR) 10 MG tablet Take 10 mg by mouth as needed.     dicyclomine (BENTYL) 20 MG tablet Take 20 mg by mouth 4 (four) times daily.     Multiple Vitamins-Minerals (MULTIVITAMIN WITH MINERALS) tablet Take 1 tablet by mouth daily. 100 tablet 0   ondansetron (ZOFRAN) 4 MG tablet Take 1 tablet (4 mg total) by mouth as needed for nausea or vomiting. 30 tablet 0   Biotin 1 MG CAPS Take by mouth. (Patient not taking: Reported on 05/14/2022)     cyanocobalamin 1000 MCG tablet Take by mouth. (Patient not taking: Reported on 05/14/2022)     DULoxetine (CYMBALTA) 60 MG capsule Take 1 capsule  by mouth daily.     Multiple Vitamin (MULTIVITAMIN) capsule Take 1 capsule by mouth daily. (Patient not taking: Reported on 05/14/2022) 100 capsule 0   Multiple Vitamins-Minerals (MULTIVITAMIN WITH MINERALS) tablet Take 1 tablet by mouth daily. (Patient not taking: Reported on 05/14/2022) 100 tablet 0   omeprazole (PRILOSEC) 40 MG capsule Take 40 mg by mouth in the morning and at bedtime.     No current facility-administered medications for this visit.    OBJECTIVE: Vitals:   08/22/22 1419  08/22/22 1421  BP: (!) 127/95   Pulse: 100   Resp: 16   Temp: 99.3 F (37.4 C) 99.3 F (37.4 C)  SpO2: 100%      Body mass index is 25.96 kg/m.    ECOG FS:0 - Asymptomatic  General: Well-developed, well-nourished, no acute distress. Eyes: Pink conjunctiva, anicteric sclera. HEENT: Normocephalic, moist mucous membranes. Lungs: No audible wheezing or coughing. Heart: Regular rate and rhythm. Abdomen: Soft, nontender, no obvious distention. Musculoskeletal: No edema, cyanosis, or clubbing. Neuro: Alert, answering all questions appropriately. Cranial nerves grossly intact. Skin: No rashes or petechiae noted. Psych: Normal affect.  LAB RESULTS:  No results found for: "NA", "K", "CL", "CO2", "GLUCOSE", "BUN", "CREATININE", "CALCIUM", "PROT", "ALBUMIN", "AST", "ALT", "ALKPHOS", "BILITOT", "GFRNONAA", "GFRAA"  Lab Results  Component Value Date   WBC 10.1 08/22/2022   NEUTROABS 6.6 08/22/2022   HGB 16.5 (H) 08/22/2022   HCT 50.1 (H) 08/22/2022   MCV 93.6 08/22/2022   PLT 327 08/22/2022   Lab Results  Component Value Date   IRON 109 12/12/2021   TIBC 386 12/12/2021   IRONPCTSAT 28 12/12/2021   Lab Results  Component Value Date   FERRITIN 72 12/12/2021     STUDIES: No results found.  ASSESSMENT: Polycythemia.  PLAN:    Polycythemia: Upon review of patient's records, her hemoglobin has been persistently elevated and trending up since at least March 2022.  Iron stores, erythropoietin level, hemochromatosis mutation, JAK2, CALR, MPL, and E12-15 mutations are all negative.  Carbon monoxide levels are only mildly elevated at 4.5.  Patient's hemoglobin is 16.5 today, therefore she does not require additional phlebotomy.  Return to clinic in 3 months with repeat laboratory work, further evaluation, and continuation of treatment if needed.    Fatigue/GI symptoms: Chronic and unchanged.  Continue follow-up with primary care at [facility] as scheduled.    Hypertension: Patient had  improved blood pressure control.  Continue monitoring and treatment per primary care.   Patient expressed understanding and was in agreement with this plan. She also understands that She can call clinic at any time with any questions, concerns, or complaints.    [name], MD   08/23/2022 1:11 PM

## 2022-08-23 ENCOUNTER — Ambulatory Visit (LOCAL_COMMUNITY_HEALTH_CENTER): Payer: Medicaid Other | Admitting: Family Medicine

## 2022-08-23 ENCOUNTER — Encounter: Payer: Self-pay | Admitting: Oncology

## 2022-08-23 VITALS — BP 126/84 | HR 105 | Ht 69.0 in | Wt 175.0 lb

## 2022-08-23 DIAGNOSIS — Z0000 Encounter for general adult medical examination without abnormal findings: Secondary | ICD-10-CM

## 2022-08-23 DIAGNOSIS — Z309 Encounter for contraceptive management, unspecified: Secondary | ICD-10-CM

## 2022-08-23 DIAGNOSIS — Z3009 Encounter for other general counseling and advice on contraception: Secondary | ICD-10-CM

## 2022-08-23 MED ORDER — MEDROXYPROGESTERONE ACETATE 150 MG/ML IM SUSP
150.0000 mg | INTRAMUSCULAR | Status: DC
Start: 2022-08-23 — End: 2023-10-10
  Administered 2022-10-25 – 2023-07-25 (×4): 150 mg via INTRAMUSCULAR

## 2022-08-23 NOTE — Progress Notes (Signed)
[facility] DEPARTMENT [facility] [address] Main Number: [phone]  Family Planning Visit- Repeat Yearly Visit  Subjective:  Maria Gallegos is a 37 y.o. G1P1001  being seen today for an annual wellness visit and to discuss contraception options.   The patient is currently using Hormonal Injection for pregnancy prevention. Patient does not want a pregnancy in the next year.    report they are looking for a method that provides High efficacy at preventing pregnancy   Patient has the following medical problems: has Depression; Anxiety; History of abnormal cervical Pap smear; Perforated tympanic membrane, left; Elevated blood pressure reading 141/80 on 06/30/20; Smoker 1/2-1 ppd; Alcohol abuse, daily use; and Polycythemia on their problem list.  Chief Complaint  Patient presents with   Annual Exam    Patient reports to clinic for PE and depo prescription refill  See flowsheet for other program required questions.   Body mass index is 25.84 kg/m. - Patient is eligible for diabetes screening based on BMI> 25 and age >35?  yes HA1C ordered? No- has PCP, A1c done within the last year= 4.9  Patient reports 1 of partners in last year. Desires STI screening?  No - declined   Has patient been screened once for HCV in the past?  No  No results found for: "HCVAB"  Does the patient have current of drug use, have a partner with drug use, and/or has been incarcerated since last result? No  If yes-- Screen for HCV through [facility]   Does the patient meet criteria for HBV testing? No  Criteria:  -Household, sexual or needle sharing contact with HBV -History of drug use -HIV positive -Those with known Hep C   Health Maintenance Due  Topic Date Due   Hepatitis C Screening  Never done   COVID-19 Vaccine (1 - 2023-24 season) Never done   INFLUENZA VACCINE  08/08/2022    Review of Systems  Constitutional:  Positive for malaise/fatigue.  Negative for weight loss.  Eyes:  Positive for blurred vision.  Respiratory:  Positive for shortness of breath. Negative for cough.   Cardiovascular:  Negative for claudication.  Gastrointestinal:  Positive for nausea.  Genitourinary:  Negative for dysuria and frequency.  Skin:  Negative for rash.  Neurological:  Positive for dizziness and headaches.  Endo/Heme/Allergies:  Bruises/bleeds easily.    The following portions of the patient's history were reviewed and updated as appropriate: allergies, current medications, past family history, past medical history, past social history, past surgical history and problem list. Problem list updated.  Objective:   Vitals:   08/23/22 1312  BP: 126/84  Pulse: (!) 105  Weight: 175 lb (79.4 kg)  Height: 5\' 9"  (1.753 m)    Physical Exam Constitutional:      Appearance: Normal appearance.  HENT:     Head: Normocephalic and atraumatic.  Pulmonary:     Effort: Pulmonary effort is normal.  Abdominal:     Palpations: Abdomen is soft.  Musculoskeletal:        General: Normal range of motion.  Skin:    General: Skin is warm and dry.  Neurological:     General: No focal deficit present.     Mental Status: She is alert.  Psychiatric:        Mood and Affect: Mood normal.        Behavior: Behavior normal.   Declined CBE today    Assessment and Plan:  Maria Gallegos is  a 37 y.o. female G1P1001 presenting to the [facility] Department for an yearly wellness and contraception visit  1. Family planning Contraception counseling: Reviewed options based on patient desire and reproductive life plan. Patient is interested in Hormonal Injection. This was provided to the patient today.   Risks, benefits, and typical effectiveness rates were reviewed.  Questions were answered.  Written information was also given to the patient to review.    The patient will follow up in  3 months for surveillance.  The patient was told to call with  any further questions, or with any concerns about this method of contraception.  Emphasized use of condoms 100% of the time for STI prevention.  Educated on ECP and assessed need for ECP. Not indicated- covered under depo window - medroxyPROGESTERone (DEPO-PROVERA) injection 150 mg  2. Well woman exam (no gynecological exam) -declined CBE today -reports family hx of early breast cancer- encouraged to ask PCP for early mammogram -Pap smear last done 04/26/19 (repeat due 04/2024) -c/o many symptoms of ROS- reports she believes these are all related to her high RBC count which is being evaluated with hematology -well appearing female in no acute distress -marked calf pain on ROS- but states this is due to DDD that leads to pain in 1 calf (no redness or swelling noted) -reports occasional SOB- which she also attributes to her RBCs- no wheezing or difficulty breathing today   Return in about 3 months (around 11/23/2022) for depo injection.  Future Appointments  Date Time Provider Department Center  11/26/2022  1:30 PM [facility] None  11/26/2022  2:00 PM [name], MD [facility] None  11/26/2022  2:30 PM [facility] 2 [facility] None    Lenice Llamas, [place]

## 2022-08-23 NOTE — Progress Notes (Signed)
 Pt is here for family planning visit.  Family planning packet reviewed and given to pt.   Condoms declined. Gaspar Garbe, RN

## 2022-08-27 ENCOUNTER — Ambulatory Visit
Admission: RE | Admit: 2022-08-27 | Discharge: 2022-08-27 | Disposition: A | Discharge status: Home / Self Care | Payer: Medicaid Other | Source: Ambulatory Visit | Attending: Emergency Medicine | Admitting: Emergency Medicine

## 2022-08-27 VITALS — BP 136/94 | HR 108 | Temp 98.0°F | Resp 18

## 2022-08-27 DIAGNOSIS — Z113 Encounter for screening for infections with a predominantly sexual mode of transmission: Secondary | ICD-10-CM | POA: Insufficient documentation

## 2022-08-27 DIAGNOSIS — J029 Acute pharyngitis, unspecified: Secondary | ICD-10-CM | POA: Insufficient documentation

## 2022-08-27 LAB — POCT URINE PREGNANCY: Preg Test, Ur: NEGATIVE

## 2022-08-27 LAB — POCT RAPID STREP A (OFFICE): Rapid Strep A Screen: NEGATIVE

## 2022-08-27 NOTE — Discharge Instructions (Addendum)
Your strep test is negative.    Your other tests are pending.  If your test results are positive, we will call you.  You may require treatment at that time.  Do not have sexual activity for at least 7 days.    Follow up with your primary care provider if your symptoms are not improving.

## 2022-08-27 NOTE — ED Provider Notes (Signed)
[facility]al[facility]    CSN: [id number] Arrival date & time: 08/27/22  1644      History   Chief Complaint Chief Complaint  Patient presents with   Sore Throat    Sort throat and tongue. Possible swelling - Entered by patient    HPI Maria Gallegos is a 37 y.o. female.  Patient presents with 1 day history of sore throat.  She is concerned for strep throat or possible STD.  She requests STD testing.  No fever, rash, shortness of breath, difficulty swallowing, or other symptoms.  Her medical history includes anxiety, depression, GERD, alcohol abuse, current everyday smoker.  The history is provided by the patient and medical records.    Past Medical History:  Diagnosis Date   Anxiety    Depression    GERD (gastroesophageal reflux disease)     Patient Active Problem List   Diagnosis Date Noted   Polycythemia 01/01/2022   Elevated blood pressure reading 141/80 on 06/30/20 06/30/2020   Smoker 1/2-1 ppd 06/30/2020   Alcohol abuse, daily use 06/30/2020   Depression 09/21/2015   Anxiety 09/21/2015   History of abnormal cervical Pap smear 09/21/2015   Perforated tympanic membrane, left 12/22/2013    Past Surgical History:  Procedure Laterality Date   LEEP     TONSILLECTOMY AND ADENOIDECTOMY  1997    OB History     Gravida  1   Para  1   Term  1   Preterm  0   AB  0   Living  1      SAB  0   IAB  0   Ectopic  0   Multiple  0   Live Births  1            Home Medications    Prior to Admission medications   Medication Sig Start Date End Date Taking? Authorizing Provider  Biotin 1 MG CAPS Take by mouth. Patient not taking: Reported on 05/14/2022    [provider]  busPIRone (BUSPAR) 10 MG tablet Take 10 mg by mouth as needed. Patient not taking: Reported on 08/23/2022    [provider]  cyanocobalamin 1000 MCG tablet Take by mouth. Patient not taking: Reported on 05/14/2022    [provider]  dicyclomine (BENTYL)  20 MG tablet Take 20 mg by mouth 4 (four) times daily. Patient not taking: Reported on 08/23/2022 03/08/22   [provider]  DULoxetine (CYMBALTA) 60 MG capsule Take 1 capsule by mouth daily. 07/31/21 07/31/22  [provider]  Multiple Vitamin (MULTIVITAMIN) capsule Take 1 capsule by mouth daily. Patient not taking: Reported on 05/14/2022 11/03/18   Federico Flake, MD  Multiple Vitamins-Minerals (MULTIVITAMIN WITH MINERALS) tablet Take 1 tablet by mouth daily. Patient not taking: Reported on 05/14/2022 07/15/19   Federico Flake, MD  Multiple Vitamins-Minerals (MULTIVITAMIN WITH MINERALS) tablet Take 1 tablet by mouth daily. Patient not taking: Reported on 08/23/2022 10/08/19   Federico Flake, MD  omeprazole (PRILOSEC) 40 MG capsule Take 40 mg by mouth in the morning and at bedtime. 06/18/21 06/18/22  [provider]  ondansetron (ZOFRAN) 4 MG tablet Take 1 tablet (4 mg total) by mouth as needed for nausea or vomiting. 08/22/22   [name], MD    Family History Family History  Problem Relation Age of Onset   Healthy Father    Endometriosis Sister    Bipolar disorder Sister    Alcohol abuse Maternal Grandfather  Breast cancer Paternal Grandmother 21   Breast cancer Paternal Aunt     Social History Social History   Tobacco Use   Smoking status: Every Day    Current packs/day: 0.50    Average packs/day: 0.5 packs/day for 38.2 years (19.1 ttl pk-yrs)    Types: Cigarettes, Cigars    Start date: 06/22/2002   Smokeless tobacco: Never  Vaping Use   Vaping status: Never Used  Substance Use Topics   Alcohol use: Yes    Alcohol/week: 2.0 standard drinks of alcohol    Types: 1 Cans of beer, 1 Shots of liquor per week    Comment: daily use   Drug use: Never     Allergies   Penicillins and Vicodin [hydrocodone-acetaminophen]   Review of Systems Review of Systems  Constitutional:  Negative for chills and fever.  HENT:  Positive for  sore throat. Negative for ear pain, trouble swallowing and voice change.   Respiratory:  Negative for cough and shortness of breath.   Cardiovascular:  Negative for chest pain and palpitations.  Gastrointestinal:  Negative for abdominal pain, nausea and vomiting.  Genitourinary:  Negative for dysuria, flank pain, frequency, hematuria, pelvic pain and vaginal discharge.     Physical Exam Triage Vital Signs ED Triage Vitals [08/27/22 1707]  Encounter Vitals Group     BP      Systolic BP Percentile      Diastolic BP Percentile      Pulse Rate (!) 108     Resp 18     Temp 98 F (36.7 C)     Temp src      SpO2 99 %     Weight      Height      Head Circumference      Peak Flow      Pain Score      Pain Loc      Pain Education      Exclude from Growth Chart    No data found.  Updated Vital Signs BP (!) 136/94 (BP Location: Right Arm)   Pulse (!) 108   Temp 98 F (36.7 C)   Resp 18   LMP  (LMP Unknown)   SpO2 99%   Visual Acuity Right Eye Distance:   Left Eye Distance:   Bilateral Distance:    Right Eye Near:   Left Eye Near:    Bilateral Near:     Physical Exam Vitals and nursing note reviewed.  Constitutional:      General: She is not in acute distress.    Appearance: She is well-developed.  HENT:     Right Ear: Tympanic membrane normal.     Left Ear: Tympanic membrane normal.     Nose: Nose normal.     Mouth/Throat:     Mouth: Mucous membranes are moist.     Pharynx: Posterior oropharyngeal erythema present.  Cardiovascular:     Rate and Rhythm: Normal rate and regular rhythm.     Heart sounds: Normal heart sounds.  Pulmonary:     Effort: Pulmonary effort is normal. No respiratory distress.     Breath sounds: Normal breath sounds.  Musculoskeletal:     Cervical back: Neck supple.  Skin:    General: Skin is warm and dry.  Neurological:     Mental Status: She is alert.      UC Treatments / Results  Labs (all labs ordered are listed, but only  abnormal results are displayed) Labs Reviewed  RPR  HIV ANTIBODY (ROUTINE TESTING W REFLEX)  POCT RAPID STREP A (OFFICE)  POCT URINE PREGNANCY  CERVICOVAGINAL ANCILLARY ONLY  CYTOLOGY, (ORAL, ANAL, URETHRAL) ANCILLARY ONLY    EKG   Radiology No results found.  Procedures Procedures (including critical care time)  Medications Ordered in UC Medications - No data to display  Initial Impression / Assessment and Plan / UC Course  I have reviewed the triage vital signs and the nursing notes.  Pertinent labs & imaging results that were available during my care of the patient were reviewed by me and considered in my medical decision making (see chart for details).    Acute pharyngitis, STD screening.  Rapid strep negative.  Per patient request, throat swabbed for GC and chlamydia.  Patient also obtained vaginal swab for STD testing.  HIV and RPR pending as well.  Discussed with patient that we will call her if her test results are positive and that she may require treatment at that time.  Education provided on pharyngitis.  Instructed her to follow-up with her PCP if she is not improving.  She agrees to plan of care.  Final Clinical Impressions(s) / UC Diagnoses   Final diagnoses:  Screening for STD (sexually transmitted disease)  Acute pharyngitis, unspecified etiology     Discharge Instructions      Your strep test is negative.    Your other tests are pending.  If your test results are positive, we will call you.  You may require treatment at that time.  Do not have sexual activity for at least 7 days.    Follow up with your primary care provider if your symptoms are not improving.          ED Prescriptions   None    PDMP not reviewed this encounter.   Mickie Bail, NP 08/27/22 [phone]

## 2022-08-27 NOTE — ED Triage Notes (Signed)
Pt presents with a sore throat since yesterday. She has tried Mucinex for her symptoms.

## 2022-08-28 LAB — CERVICOVAGINAL ANCILLARY ONLY
Bacterial Vaginitis (gardnerella): NEGATIVE
Candida Glabrata: NEGATIVE
Candida Vaginitis: POSITIVE — AB
Chlamydia: NEGATIVE
Comment: NEGATIVE
Comment: NEGATIVE
Comment: NEGATIVE
Comment: NEGATIVE
Comment: NEGATIVE
Comment: NORMAL
Neisseria Gonorrhea: NEGATIVE
Trichomonas: NEGATIVE

## 2022-08-28 LAB — CYTOLOGY, (ORAL, ANAL, URETHRAL) ANCILLARY ONLY
Chlamydia: NEGATIVE
Comment: NEGATIVE
Comment: NORMAL
Neisseria Gonorrhea: NEGATIVE

## 2022-08-28 LAB — HIV ANTIBODY (ROUTINE TESTING W REFLEX): HIV Screen 4th Generation wRfx: NONREACTIVE

## 2022-08-28 LAB — RPR: RPR Ser Ql: NONREACTIVE

## 2022-08-29 ENCOUNTER — Telehealth: Payer: Self-pay

## 2022-08-29 MED ORDER — FLUCONAZOLE 150 MG PO TABS: 150.0000 mg | ORAL_TABLET | Freq: Once | ORAL | 0 refills | Status: AC

## 2022-08-29 NOTE — Telephone Encounter (Signed)
Per protocol, pt requires tx with Diflucan. Attempted to reach patient x1. LVM. Rx sent to pharmacy on file.

## 2022-10-25 ENCOUNTER — Ambulatory Visit (LOCAL_COMMUNITY_HEALTH_CENTER): Payer: Medicaid Other

## 2022-10-25 VITALS — BP 127/87 | Ht 69.0 in | Wt 175.5 lb

## 2022-10-25 DIAGNOSIS — Z309 Encounter for contraceptive management, unspecified: Secondary | ICD-10-CM

## 2022-10-25 DIAGNOSIS — Z3009 Encounter for other general counseling and advice on contraception: Secondary | ICD-10-CM

## 2022-10-25 DIAGNOSIS — Z30013 Encounter for initial prescription of injectable contraceptive: Secondary | ICD-10-CM

## 2022-10-25 DIAGNOSIS — Z3042 Encounter for surveillance of injectable contraceptive: Secondary | ICD-10-CM

## 2022-10-25 NOTE — Progress Notes (Signed)
12 weeks 0 days post depo. Voices no concerns. Depo given today per order by Aliene Altes, FNP dated 08/23/2022. Tolerated well R delt Next depo due 01/10/2023, patient aware. Jerel Shepherd, RN

## 2022-11-25 ENCOUNTER — Other Ambulatory Visit: Payer: Self-pay

## 2022-11-25 DIAGNOSIS — D751 Secondary polycythemia: Secondary | ICD-10-CM

## 2022-11-26 ENCOUNTER — Inpatient Hospital Stay: Payer: Medicaid Other

## 2022-11-26 ENCOUNTER — Inpatient Hospital Stay: Payer: Medicaid Other | Admitting: Oncology

## 2022-11-26 ENCOUNTER — Inpatient Hospital Stay: Payer: Medicaid Other | Attending: Oncology

## 2022-11-26 VITALS — BP 124/93 | HR 105

## 2022-11-26 VITALS — BP 149/92 | HR 104 | Temp 98.3°F | Wt 172.0 lb

## 2022-11-26 DIAGNOSIS — D751 Secondary polycythemia: Secondary | ICD-10-CM | POA: Diagnosis not present

## 2022-11-26 DIAGNOSIS — I10 Essential (primary) hypertension: Secondary | ICD-10-CM | POA: Insufficient documentation

## 2022-11-26 DIAGNOSIS — Z803 Family history of malignant neoplasm of breast: Secondary | ICD-10-CM | POA: Insufficient documentation

## 2022-11-26 DIAGNOSIS — F17210 Nicotine dependence, cigarettes, uncomplicated: Secondary | ICD-10-CM | POA: Diagnosis not present

## 2022-11-26 LAB — CBC WITH DIFFERENTIAL (CANCER CENTER ONLY)
Abs Immature Granulocytes: 0.03 10*3/uL (ref 0.00–0.07)
Basophils Absolute: 0.1 10*3/uL (ref 0.0–0.1)
Basophils Relative: 1 %
Eosinophils Absolute: 0.3 10*3/uL (ref 0.0–0.5)
Eosinophils Relative: 5 %
HCT: 52.6 % — ABNORMAL HIGH (ref 36.0–46.0)
Hemoglobin: 17.3 g/dL — ABNORMAL HIGH (ref 12.0–15.0)
Immature Granulocytes: 0 %
Lymphocytes Relative: 22 %
Lymphs Abs: 1.5 10*3/uL (ref 0.7–4.0)
MCH: 30.2 pg (ref 26.0–34.0)
MCHC: 32.9 g/dL (ref 30.0–36.0)
MCV: 92 fL (ref 80.0–100.0)
Monocytes Absolute: 0.5 10*3/uL (ref 0.1–1.0)
Monocytes Relative: 8 %
Neutro Abs: 4.4 10*3/uL (ref 1.7–7.7)
Neutrophils Relative %: 64 %
Platelet Count: 256 10*3/uL (ref 150–400)
RBC: 5.72 MIL/uL — ABNORMAL HIGH (ref 3.87–5.11)
RDW: 17 % — ABNORMAL HIGH (ref 11.5–15.5)
WBC Count: 6.8 10*3/uL (ref 4.0–10.5)
nRBC: 0 % (ref 0.0–0.2)

## 2022-11-26 NOTE — Patient Instructions (Signed)

## 2022-11-26 NOTE — Progress Notes (Signed)
[facility]  [phone] [phone]  ID: Maria Gallegos OB: 05/22/1985  MR#: [id number]  AOZ#:[id number]  Patient Care Team: [facility] as PCP - General  CHIEF COMPLAINT: Polycythemia.  INTERVAL HISTORY: Patient returns to clinic today for repeat laboratory work, further evaluation, consideration of additional phlebotomy.  She continues to have fatigue, but otherwise feels well.  She has no neurologic complaints.  She denies any recent fevers or illnesses.  She has a good appetite and denies weight loss.  She has no chest pain, shortness of breath, cough, or hemoptysis.  She denies any nausea, vomiting, constipation, or diarrhea.  She has no urinary complaints.  Patient offers no further specific complaints today.  REVIEW OF SYSTEMS:   Review of Systems  Constitutional:  Positive for malaise/fatigue. Negative for fever and weight loss.  Respiratory: Negative.  Negative for cough, hemoptysis and shortness of breath.   Cardiovascular: Negative.  Negative for chest pain and leg swelling.  Gastrointestinal: Negative.  Negative for abdominal pain and diarrhea.  Genitourinary: Negative.  Negative for dysuria.  Skin: Negative.  Negative for rash.  Neurological: Negative.  Negative for dizziness, focal weakness, weakness and headaches.  Psychiatric/Behavioral: Negative.  The patient is not nervous/anxious.     As per HPI. Otherwise, a complete review of systems is negative.  PAST MEDICAL HISTORY: Past Medical History:  Diagnosis Date   Anxiety    Depression    GERD (gastroesophageal reflux disease)     PAST SURGICAL HISTORY: Past Surgical History:  Procedure Laterality Date   LEEP     TONSILLECTOMY AND ADENOIDECTOMY  1997    FAMILY HISTORY: Family History  Problem Relation Age of Onset   Healthy Father    Endometriosis Sister    Bipolar disorder Sister    Alcohol abuse Maternal Grandfather    Breast cancer Paternal Grandmother  61   Breast cancer Paternal Aunt     ADVANCED DIRECTIVES (Y/N):  N  HEALTH MAINTENANCE: Social History   Tobacco Use   Smoking status: Every Day    Current packs/day: 0.50    Average packs/day: 0.5 packs/day for 38.4 years (19.2 ttl pk-yrs)    Types: Cigarettes, Cigars    Start date: 06/22/2002   Smokeless tobacco: Never  Vaping Use   Vaping status: Never Used  Substance Use Topics   Alcohol use: Yes    Alcohol/week: 2.0 standard drinks of alcohol    Types: 1 Cans of beer, 1 Shots of liquor per week    Comment: daily use   Drug use: Never     Colonoscopy:  PAP:  Bone density:  Lipid panel:  Allergies  Allergen Reactions   Penicillins Itching and Rash   Vicodin [Hydrocodone-Acetaminophen] Itching and Rash    Current Outpatient Medications  Medication Sig Dispense Refill   Biotin 1 MG CAPS Take by mouth. (Patient not taking: Reported on 05/14/2022)     busPIRone (BUSPAR) 10 MG tablet Take 10 mg by mouth as needed. (Patient not taking: Reported on 08/23/2022)     cyanocobalamin 1000 MCG tablet Take by mouth. (Patient not taking: Reported on 05/14/2022)     dicyclomine (BENTYL) 20 MG tablet Take 20 mg by mouth 4 (four) times daily. (Patient not taking: Reported on 08/23/2022)     DULoxetine (CYMBALTA) 60 MG capsule Take 1 capsule by mouth daily.     Multiple Vitamin (MULTIVITAMIN) capsule Take 1 capsule by mouth daily. (Patient not taking: Reported on 05/14/2022) 100 capsule 0   Multiple  Vitamins-Minerals (MULTIVITAMIN WITH MINERALS) tablet Take 1 tablet by mouth daily. (Patient not taking: Reported on 05/14/2022) 100 tablet 0   Multiple Vitamins-Minerals (MULTIVITAMIN WITH MINERALS) tablet Take 1 tablet by mouth daily. (Patient not taking: Reported on 08/23/2022) 100 tablet 0   omeprazole (PRILOSEC) 40 MG capsule Take 40 mg by mouth in the morning and at bedtime.     ondansetron (ZOFRAN) 4 MG tablet Take 1 tablet (4 mg total) by mouth as needed for nausea or vomiting. 30 tablet 0    Current Facility-Administered Medications  Medication Dose Route Frequency Provider Last Rate Last Admin   medroxyPROGESTERone (DEPO-PROVERA) injection 150 mg  150 mg Intramuscular Q90 days    150 mg at 10/25/22 1431    OBJECTIVE: Vitals:   11/26/22 1340  BP: (!) 149/92  Pulse: (!) 104  Temp: 98.3 F (36.8 C)  SpO2: 100%     Body mass index is 25.4 kg/m.    ECOG FS:0 - Asymptomatic  General: Well-developed, well-nourished, no acute distress. Eyes: Pink conjunctiva, anicteric sclera. HEENT: Normocephalic, moist mucous membranes. Lungs: No audible wheezing or coughing. Heart: Regular rate and rhythm. Abdomen: Soft, nontender, no obvious distention. Musculoskeletal: No edema, cyanosis, or clubbing. Neuro: Alert, answering all questions appropriately. Cranial nerves grossly intact. Skin: No rashes or petechiae noted. Psych: Normal affect.  LAB RESULTS:  No results found for: "NA", "K", "CL", "CO2", "GLUCOSE", "BUN", "CREATININE", "CALCIUM", "PROT", "ALBUMIN", "AST", "ALT", "ALKPHOS", "BILITOT", "GFRNONAA", "GFRAA"  Lab Results  Component Value Date   WBC 6.8 11/26/2022   NEUTROABS 4.4 11/26/2022   HGB 17.3 (H) 11/26/2022   HCT 52.6 (H) 11/26/2022   MCV 92.0 11/26/2022   PLT 256 11/26/2022   Lab Results  Component Value Date   IRON 109 12/12/2021   TIBC 386 12/12/2021   IRONPCTSAT 28 12/12/2021   Lab Results  Component Value Date   FERRITIN 72 12/12/2021     STUDIES: No results found.  ASSESSMENT: Polycythemia.  PLAN:    Polycythemia: Upon review of patient's records, her hemoglobin has been persistently elevated and trending up since at least March 2022.  Iron stores, erythropoietin level, hemochromatosis mutation, JAK2, CALR, MPL, and E12-15 mutations are all negative.  Carbon monoxide levels are only mildly elevated at 4.5.  Patient's hemoglobin has trended up to 17.3, therefore we will proceed with phlebotomy today.  It appears she only needs lobotomy  once or twice per year and therefore return to clinic in 6 months with repeat laboratory work, further evaluation, and continuation of treatment if needed.   Hypertension: Chronic and unchanged.  Continue monitoring and treatment per primary care.  I spent a total of 20 minutes reviewing chart data, face-to-face evaluation with the patient, counseling and coordination of care as detailed above.    Patient expressed understanding and was in agreement with this plan. She also understands that She can call clinic at any time with any questions, concerns, or complaints.    [name], MD   11/26/2022 4:28 PM

## 2022-12-27 ENCOUNTER — Encounter: Payer: Self-pay | Admitting: Nurse Practitioner

## 2022-12-27 ENCOUNTER — Telehealth: Payer: Self-pay

## 2022-12-27 ENCOUNTER — Ambulatory Visit: Payer: Medicaid Other | Admitting: Nurse Practitioner

## 2022-12-27 VITALS — BP 118/82 | HR 93 | Temp 97.3°F | Ht 69.0 in | Wt 172.2 lb

## 2022-12-27 DIAGNOSIS — R079 Chest pain, unspecified: Secondary | ICD-10-CM | POA: Diagnosis not present

## 2022-12-27 DIAGNOSIS — Z803 Family history of malignant neoplasm of breast: Secondary | ICD-10-CM | POA: Insufficient documentation

## 2022-12-27 DIAGNOSIS — F419 Anxiety disorder, unspecified: Secondary | ICD-10-CM | POA: Diagnosis not present

## 2022-12-27 DIAGNOSIS — R030 Elevated blood-pressure reading, without diagnosis of hypertension: Secondary | ICD-10-CM | POA: Diagnosis not present

## 2022-12-27 DIAGNOSIS — L02412 Cutaneous abscess of left axilla: Secondary | ICD-10-CM | POA: Diagnosis not present

## 2022-12-27 DIAGNOSIS — M5416 Radiculopathy, lumbar region: Secondary | ICD-10-CM

## 2022-12-27 DIAGNOSIS — K219 Gastro-esophageal reflux disease without esophagitis: Secondary | ICD-10-CM

## 2022-12-27 DIAGNOSIS — D751 Secondary polycythemia: Secondary | ICD-10-CM

## 2022-12-27 DIAGNOSIS — F32A Depression, unspecified: Secondary | ICD-10-CM

## 2022-12-27 DIAGNOSIS — Z1231 Encounter for screening mammogram for malignant neoplasm of breast: Secondary | ICD-10-CM

## 2022-12-27 MED ORDER — DULOXETINE HCL 30 MG PO CPEP
30.0000 mg | ORAL_CAPSULE | Freq: Every day | ORAL | 0 refills | Status: DC
Start: 2022-12-27 — End: 2023-01-31

## 2022-12-27 MED ORDER — PANTOPRAZOLE SODIUM 40 MG PO TBEC: 40.0000 mg | DELAYED_RELEASE_TABLET | Freq: Every day | ORAL | 11 refills | Status: DC

## 2022-12-27 MED ORDER — ESCITALOPRAM OXALATE 10 MG PO TABS: 10.0000 mg | ORAL_TABLET | Freq: Every day | ORAL | 2 refills | Status: DC

## 2022-12-27 MED ORDER — SULFAMETHOXAZOLE-TRIMETHOPRIM 800-160 MG PO TABS
1.0000 | ORAL_TABLET | Freq: Two times a day (BID) | ORAL | 0 refills | Status: DC
Start: 2022-12-27 — End: 2023-01-31

## 2022-12-27 NOTE — Assessment & Plan Note (Signed)
They have experienced intermittent chest pain for a couple of months, not associated with shortness of breath or radiation to arms or jaw. Not currently experiencing chest pain while in office. Discussed potential causes. They should document instances of chest pain, including duration and activities surrounding the pain. If chest pain becomes severe or is associated with other symptoms, they must seek immediate medical attention. Strict precautions given to patient.

## 2022-12-27 NOTE — Progress Notes (Signed)
Bethanie Dicker, NP-C Phone: [phone]  Maria Gallegos is a 37 y.o. female who presents today to establish care.   Discussed the use of AI scribe software for clinical note transcription with the patient, who gave verbal consent to proceed.  History of Present Illness   The patient, with a history of polycythemia, anxiety, depression, and acid reflux, presents with ongoing gastrointestinal and mood-related concerns. They report a daily medication regimen of Cymbalta, omeprazole, and Bentyl, the latter prescribed for a diagnosis of Irritable Bowel Syndrome (IBS). The patient describes persistent abdominal pain, fluctuating between diarrhea and constipation, and admits to inconsistent use of Bentyl due to perceived lack of efficacy.  The patient's acid reflux manifests as belching and regurgitation, with omeprazole providing only partial relief. They have not tried other medications for this condition and report being on the highest dose of omeprazole. The patient also reports a history of polycythemia, managed with biannual phlebotomies.  Regarding their mood, the patient has been on Cymbalta for an extended period, with previous trials of other medications. They express a desire for additional support, suggesting interest in seeing a therapist. The patient also reports high levels of anxiety and depression, with no current thoughts of self-harm.  The patient has been experiencing lumps under their armpits, which have been present for a few weeks. They describe these lumps as extremely painful. The patient also reports chest pain, and located between the shoulder blades, which has been ongoing for a couple of months. She is not currently experiencing the pain. The patient also reports fluctuating blood pressure readings, with some recent measurements indicating hypertension. She denies shortness of breath and   The patient has a family history of breast cancer and expresses concern about their  risk. They are interested in genetic testing and early mammogram screening due to their family history.      Active Ambulatory Problems    Diagnosis Date Noted  . Anxiety and depression 09/21/2015  . History of abnormal cervical Pap smear 09/21/2015  . Perforated tympanic membrane, left 12/22/2013  . Elevated blood pressure reading without diagnosis of hypertension 06/30/2020  . Smoker 1/2-1 ppd 06/30/2020  . Alcohol abuse, daily use 06/30/2020  . Polycythemia 01/01/2022  . Gastroesophageal reflux disease 12/27/2022  . Family history of breast cancer 12/27/2022  . Lumbar radiculopathy 12/27/2022  . Abscess of left axilla 12/27/2022  . Chest pain 12/27/2022   Resolved Ambulatory Problems    Diagnosis Date Noted  . Depression 09/21/2015   Past Medical History:  Diagnosis Date  . Anxiety   . GERD (gastroesophageal reflux disease)   . Hypertension 03/2022    Family History  Problem Relation Age of Onset  . Healthy Father   . Endometriosis Sister   . Bipolar disorder Sister   . Alcohol abuse Maternal Grandfather   . Breast cancer Paternal Grandmother 43  . Cancer Paternal Grandmother   . Breast cancer Paternal Aunt   . Cancer Paternal Aunt     Social History   Socioeconomic History  . Marital status: Single    Spouse name: Not on file  . Number of children: Not on file  . Years of education: Not on file  . Highest education level: GED or equivalent  Occupational History  . Not on file  Tobacco Use  . Smoking status: Every Day    Current packs/day: 0.50    Average packs/day: 0.5 packs/day for 38.5 years (19.3 ttl pk-yrs)    Types: Cigarettes    Start date:  06/22/2002  . Smokeless tobacco: Never  Vaping Use  . Vaping status: Never Used  Substance and Sexual Activity  . Alcohol use: Yes    Alcohol/week: 2.0 standard drinks of alcohol    Types: 1 Cans of beer, 1 Shots of liquor per week    Comment: daily use  . Drug use: Never  . Sexual activity: Not Currently     Partners: Male    Birth control/protection: Injection  Other Topics Concern  . Not on file  Social History Narrative  . Not on file   Social Drivers of Health   Financial Resource Strain: High Risk (12/26/2022)   Overall Financial Resource Strain (CARDIA)   . Difficulty of Paying Living Expenses: Very hard  Food Insecurity: Food Insecurity Present (12/26/2022)   Hunger Vital Sign   . Worried About Programme researcher, broadcasting/film/video in the Last Year: Sometimes true   . Ran Out of Food in the Last Year: Sometimes true  Transportation Needs: No Transportation Needs (12/26/2022)   PRAPARE - Transportation   . Lack of Transportation (Medical): No   . Lack of Transportation (Non-Medical): No  Physical Activity: Unknown (12/26/2022)   Exercise Vital Sign   . Days of Exercise per Week: 0 days   . Minutes of Exercise per Session: Not on file  Stress: Stress Concern Present (12/26/2022)   [employer] of Occupational Health - Occupational Stress Questionnaire   . Feeling of Stress : Very much  Social Connections: Socially Isolated (12/26/2022)   Social Connection and Isolation Panel [NHANES]   . Frequency of Communication with Friends and Family: More than three times a week   . Frequency of Social Gatherings with Friends and Family: Patient declined   . Attends Religious Services: Never   . Active Member of Clubs or Organizations: No   . Attends Banker Meetings: Not on file   . Marital Status: Never married  Intimate Partner Violence: Not At Risk (08/23/2022)   Humiliation, Afraid, Rape, and Kick questionnaire   . Fear of Current or Ex-Partner: No   . Emotionally Abused: No   . Physically Abused: No   . Sexually Abused: No    ROS  General:  Negative for unexplained weight loss, fever Skin: Negative for new or changing mole HEENT: Negative for trouble hearing, trouble seeing, ringing in ears, mouth sores, hoarseness, change in voice, dysphagia. CV:  Negative for dyspnea,  edema, palpitations Resp: Negative for cough, dyspnea, hemoptysis GI: Negative for nausea, vomiting, diarrhea, constipation, abdominal pain, melena, hematochezia. GU: Negative for dysuria, incontinence, urinary hesitance, hematuria, vaginal or penile discharge, polyuria, sexual difficulty, lumps in testicle or breasts MSK: Negative for muscle cramps or aches, joint pain or swelling Neuro: Negative for headaches, weakness, numbness, dizziness, passing out/fainting Psych: Negative for memory problems  Objective  Physical Exam Vitals:   12/27/22 1023  BP: 118/82  Pulse: 93  Temp: (!) 97.3 F (36.3 C)  SpO2: 99%    BP Readings from Last 3 Encounters:  12/27/22 118/82  11/26/22 (!) 124/93  11/26/22 (!) 149/92   Wt Readings from Last 3 Encounters:  12/27/22 172 lb 3.2 oz (78.1 kg)  11/26/22 172 lb (78 kg)  10/25/22 175 lb 8 oz (79.6 kg)    Physical Exam Constitutional:      General: She is not in acute distress.    Appearance: Normal appearance.  HENT:     Head: Normocephalic.  Cardiovascular:     Rate and Rhythm: Normal rate and regular  rhythm.     Heart sounds: Normal heart sounds.  Pulmonary:     Effort: Pulmonary effort is normal.     Breath sounds: Normal breath sounds.  Skin:    General: Skin is warm and dry.     Findings: Abscess (left axilla) present.     Comments: Left axilla with warmth, erythema, and tenderness. Area of induration present   Neurological:     General: No focal deficit present.     Mental Status: She is alert.  Psychiatric:        Mood and Affect: Mood normal.        Behavior: Behavior normal.   Assessment/Plan:   Abscess of left axilla Assessment & Plan: They present with a painful, tender lump in the axilla, likely an abscess. We will start Bactrim DS. Advised to seek immediate medical attention should the redness spread, fever develop, or the area enlarge.   Orders: -     Sulfamethoxazole-Trimethoprim; Take 1 tablet by mouth 2 (two)  times daily.  Dispense: 14 tablet; Refill: 0  Anxiety and depression Assessment & Plan: PHQ- 21 and GAD- 17 today, currently managed with Cymbalta 60mg  daily. Increase in life stressors recently, has a lot going on. Denies SI/HI. Interested in establishing with a counselor, will refer to [employer]. She would like to try a new medication. She has tried Zoloft in the past. We will decrease Cymbalta to 30 mg daily for 2 weeks then stop the Cymbalta. She will start Lexapro 10 mg daily. Counseled patient on common side effects. Encouraged to contact if worsening symptoms, unusual behavior changes or suicidal thoughts occur. She will follow up in 4 weeks to reassess mood and medication effectiveness.   Orders: -     Ambulatory referral to Psychology  Elevated blood pressure reading without diagnosis of hypertension Assessment & Plan: They have had some high readings in the past, but blood pressure was normal at this visit. They should check blood pressure at home once or twice daily and keep a log to bring to next appointment. Advised decreasing salt intake. Information on preventing hypertension provided to patient. She will follow up in 4 weeks to review home BP readings.    Chest pain, unspecified type Assessment & Plan: They have experienced intermittent chest pain for a couple of months, not associated with shortness of breath or radiation to arms or jaw. Not currently experiencing chest pain while in office. Discussed potential causes. They should document instances of chest pain, including duration and activities surrounding the pain. If chest pain becomes severe or is associated with other symptoms, they must seek immediate medical attention. Strict precautions given to patient.    Gastroesophageal reflux disease, unspecified whether esophagitis present Assessment & Plan: They experience chronic belching and regurgitation despite taking Omeprazole 40mg  daily. We will change the  medication to Protonix and advise on dietary modifications to reduce symptoms.  Orders: -     Pantoprazole Sodium; Take 1 tablet (40 mg total) by mouth daily.  Dispense: 30 tablet; Refill: 11  Polycythemia Assessment & Plan: Managed by Hematology with biannual phlebotomies. They will continue the current management plan and check labs every six months.   Lumbar radiculopathy Assessment & Plan: Managed by [facility]. Encouraged to follow up at scheduled.   Family history of breast cancer -     3D Screening Mammogram, Left and Right; Future -     Ambulatory referral to Genetics  Screening mammogram for breast cancer -  3D Screening Mammogram, Left and Right; Future    Return in about 4 weeks (around 01/24/2023) for Anxiety/Depression.   Bethanie Dicker, NP-C  Primary Care - [facility]

## 2022-12-27 NOTE — Assessment & Plan Note (Signed)
They present with a painful, tender lump in the axilla, likely an abscess. We will start Bactrim DS. Advised to seek immediate medical attention should the redness spread, fever develop, or the area enlarge.

## 2022-12-27 NOTE — Telephone Encounter (Signed)
Detailed vm left and mychart msg sent

## 2022-12-27 NOTE — Telephone Encounter (Signed)
Copied from CRM [id number]. Topic: General - Call Back - No Documentation >> Dec 27, 2022  2:42 PM Sim Boast F wrote: Reason for CRM: Patient returning Brennan Bailey phone call regarding medication. Says Kacy left her a Engineer, technical sales.

## 2022-12-27 NOTE — Assessment & Plan Note (Signed)
PHQ- 21 and GAD- 17 today, currently managed with Cymbalta 60mg  daily. Increase in life stressors recently, has a lot going on. Denies SI/HI. Interested in establishing with a counselor, will refer to [employer]. She would like to try a new medication. She has tried Zoloft in the past. We will decrease Cymbalta to 30 mg daily for 2 weeks then stop the Cymbalta. She will start Lexapro 10 mg daily. Counseled patient on common side effects. Encouraged to contact if worsening symptoms, unusual behavior changes or suicidal thoughts occur. She will follow up in 4 weeks to reassess mood and medication effectiveness.

## 2022-12-27 NOTE — Assessment & Plan Note (Signed)
They experience chronic belching and regurgitation despite taking Omeprazole 40mg  daily. We will change the medication to Protonix and advise on dietary modifications to reduce symptoms.

## 2022-12-27 NOTE — Assessment & Plan Note (Signed)
Managed by [facility]. Encouraged to follow up at scheduled.

## 2022-12-27 NOTE — Assessment & Plan Note (Addendum)
They have had some high readings in the past, but blood pressure was normal at this visit. They should check blood pressure at home once or twice daily and keep a log to bring to next appointment. Advised decreasing salt intake. Information on preventing hypertension provided to patient. She will follow up in 4 weeks to review home BP readings.

## 2022-12-27 NOTE — Assessment & Plan Note (Signed)
Managed by Hematology with biannual phlebotomies. They will continue the current management plan and check labs every six months.

## 2023-01-06 ENCOUNTER — Encounter: Payer: Self-pay | Admitting: Nurse Practitioner

## 2023-01-07 ENCOUNTER — Telehealth: Payer: Self-pay | Admitting: Genetic Counselor

## 2023-01-07 NOTE — Telephone Encounter (Signed)
 Called to get the patient scheduled per scheduling message. Patient was offered the first available appointment which is in March. Patient states she can not wait 3 months for an appointment. Patient was informed that her referral was sent in as routine and March is the first available that she can be scheduled due to limited space. Patient then states she will call her sisters to see where they had their testing done. Patient asked for referral to stay open. Lastly, the patient was informed to call back when ready to schedule an appointment.

## 2023-01-10 ENCOUNTER — Ambulatory Visit: Payer: Medicaid Other | Admitting: Nurse Practitioner

## 2023-01-13 ENCOUNTER — Encounter: Payer: Self-pay | Admitting: Oncology

## 2023-01-15 ENCOUNTER — Ambulatory Visit
Admission: RE | Admit: 2023-01-15 | Discharge: 2023-01-15 | Disposition: A | Discharge status: Home / Self Care | Payer: Medicaid Other | Source: Ambulatory Visit | Attending: Nurse Practitioner | Admitting: Nurse Practitioner

## 2023-01-15 DIAGNOSIS — Z1231 Encounter for screening mammogram for malignant neoplasm of breast: Secondary | ICD-10-CM | POA: Insufficient documentation

## 2023-01-15 DIAGNOSIS — Z803 Family history of malignant neoplasm of breast: Secondary | ICD-10-CM | POA: Insufficient documentation

## 2023-01-16 ENCOUNTER — Other Ambulatory Visit: Payer: Self-pay | Admitting: Nurse Practitioner

## 2023-01-16 DIAGNOSIS — R928 Other abnormal and inconclusive findings on diagnostic imaging of breast: Secondary | ICD-10-CM

## 2023-01-17 ENCOUNTER — Ambulatory Visit: Payer: Medicaid Other

## 2023-01-20 ENCOUNTER — Ambulatory Visit
Admission: RE | Admit: 2023-01-20 | Discharge: 2023-01-20 | Disposition: A | Discharge status: Home / Self Care | Payer: Medicaid Other | Source: Ambulatory Visit | Attending: Nurse Practitioner | Admitting: Nurse Practitioner

## 2023-01-20 DIAGNOSIS — R928 Other abnormal and inconclusive findings on diagnostic imaging of breast: Secondary | ICD-10-CM

## 2023-01-21 ENCOUNTER — Encounter: Payer: Self-pay | Admitting: Genetic Counselor

## 2023-01-21 ENCOUNTER — Telehealth: Payer: Self-pay | Admitting: Genetic Counselor

## 2023-01-21 ENCOUNTER — Encounter: Payer: Self-pay | Admitting: Nurse Practitioner

## 2023-01-21 ENCOUNTER — Other Ambulatory Visit: Payer: Self-pay | Admitting: Nurse Practitioner

## 2023-01-21 DIAGNOSIS — N63 Unspecified lump in unspecified breast: Secondary | ICD-10-CM

## 2023-01-21 DIAGNOSIS — R928 Other abnormal and inconclusive findings on diagnostic imaging of breast: Secondary | ICD-10-CM

## 2023-01-21 NOTE — Telephone Encounter (Signed)
 Scheduled appointments per scheduling message. Patient is aware of the made appointments and is active on MyChart.

## 2023-01-22 ENCOUNTER — Inpatient Hospital Stay: Payer: Medicaid Other

## 2023-01-22 ENCOUNTER — Encounter: Payer: Self-pay | Admitting: Genetic Counselor

## 2023-01-22 ENCOUNTER — Inpatient Hospital Stay: Payer: Medicaid Other | Attending: Oncology | Admitting: Genetic Counselor

## 2023-01-22 DIAGNOSIS — Z803 Family history of malignant neoplasm of breast: Secondary | ICD-10-CM

## 2023-01-22 LAB — GENETIC SCREENING ORDER

## 2023-01-22 NOTE — Progress Notes (Addendum)
 REFERRING PROVIDER: Bluford Burkitt, NP [address],  [place] [address]  PRIMARY PROVIDER:  Bluford Burkitt, NP  PRIMARY REASON FOR VISIT:  1. Family history of breast cancer      HISTORY OF PRESENT ILLNESS:   Maria Gallegos, a 38 y.o. female, was seen for a [facility] cancer genetics consultation at the request of Reather Campbell, NP due to a family history of breast cancer.  Maria Gallegos presents to clinic today to discuss the possibility of a hereditary predisposition to cancer, genetic testing, and to further clarify her future cancer risks, as well as potential cancer risks for family members.   Maria Gallegos is a 38 y.o. female with no personal history of cancer.  She reports that her paternal cousin and two sisters have had negative genetic testing.  We do not have the report to review.  CANCER HISTORY:  Oncology History   No history exists.     RISK FACTORS:  Menarche was at age 64.  First live birth at age 59.   Ovaries intact: yes.  Hysterectomy: no.  Menopausal status: premenopausal.  HRT use: 0 years. Colonoscopy: no; not examined. Mammogram within the last year: yes. Number of breast biopsies: 0. Up to date with pelvic exams: yes. Any excessive radiation exposure in the past: no  Past Medical History:  Diagnosis Date   Anxiety    Depression    Family history of breast cancer    Family history of breast cancer    GERD (gastroesophageal reflux disease)    Hypertension 03/2022    Past Surgical History:  Procedure Laterality Date   LEEP     TONSILLECTOMY AND ADENOIDECTOMY  1997    Social History   Socioeconomic History   Marital status: Single    Spouse name: Not on file   Number of children: Not on file   Years of education: Not on file   Highest education level: GED or equivalent  Occupational History   Not on file  Tobacco Use   Smoking status: Every Day    Current packs/day: 0.50    Average packs/day: 0.5 packs/day for 38.6 years  (19.3 ttl pk-yrs)    Types: Cigarettes    Start date: 06/22/2002   Smokeless tobacco: Never  Vaping Use   Vaping status: Never Used  Substance and Sexual Activity   Alcohol use: Yes    Alcohol/week: 2.0 standard drinks of alcohol    Types: 1 Cans of beer, 1 Shots of liquor per week    Comment: daily use   Drug use: Never   Sexual activity: Not Currently    Partners: Male    Birth control/protection: Injection  Other Topics Concern   Not on file  Social History Narrative   Not on file   Social Drivers of Health   Financial Resource Strain: High Risk (12/26/2022)   Overall Financial Resource Strain (CARDIA)    Difficulty of Paying Living Expenses: Very hard  Food Insecurity: Food Insecurity Present (12/26/2022)   Hunger Vital Sign    Worried About Running Out of Food in the Last Year: Sometimes true    Ran Out of Food in the Last Year: Sometimes true  Transportation Needs: No Transportation Needs (12/26/2022)   PRAPARE - Administrator, Civil Service (Medical): No    Lack of Transportation (Non-Medical): No  Physical Activity: Unknown (12/26/2022)   Exercise Vital Sign    Days of Exercise per Week: 0 days    Minutes  of Exercise per Session: Not on file  Stress: Stress Concern Present (12/26/2022)   [employer] of Occupational Health - Occupational Stress Questionnaire    Feeling of Stress : Very much  Social Connections: Socially Isolated (12/26/2022)   Social Connection and Isolation Panel [NHANES]    Frequency of Communication with Friends and Family: More than three times a week    Frequency of Social Gatherings with Friends and Family: Patient declined    Attends Religious Services: Never    Database administrator or Organizations: No    Attends Engineer, structural: Not on file    Marital Status: Never married     FAMILY HISTORY:  We obtained a detailed, 4-generation family history.  Significant diagnoses are listed below: Family History   Problem Relation Age of Onset   Healthy Father    Endometriosis Sister    Bipolar disorder Sister    Alcohol abuse Maternal Grandfather    Breast cancer Paternal Grandmother 30       second breast cancer in her 56s   Cancer Paternal Grandmother    Breast cancer Paternal Aunt 52     The patient has one daughter who is cancer free.  She has two sisters and a brother who are cancer free.  By report her sisters had negative genetic testing.  Both parents are living.  The patient's father is living and has two brotehrs and two sisters.  One sister had breast cancer around age 67 and died at 24.  Her daughter reportedly had negative genetic testing.  The paternal grandmother had breast cancer in her 76s and a second cancer in her 87s.  The patient's mother is lving at age 52.  She had one brother and five sisters who reportedly are cancer free.  There is no other reported history of cancer.  Maria Gallegos is aware of previous family history of negative genetic testing for hereditary cancer risks. There is no reported Ashkenazi Jewish ancestry. There is no known consanguinity.  GENETIC COUNSELING ASSESSMENT: Maria Gallegos is a 38 y.o. female with a family history of breast cancer which is somewhat suggestive of a hereditary cancer syndrome and predisposition to cancer given the young ages of onset. We, therefore, discussed and recommended the following at today's visit.   DISCUSSION: We discussed that, in general, most cancer is not inherited in families, but instead is sporadic or familial. Sporadic cancers occur by chance and typically happen at older ages (>50 years) as this type of cancer is caused by genetic changes acquired during an individual's lifetime. Some families have more cancers than would be expected by chance; however, the ages or types of cancer are not consistent with a known genetic mutation or known genetic mutations have been ruled out. This type of familial cancer is thought to  be due to a combination of multiple genetic, environmental, hormonal, and lifestyle factors. While this combination of factors likely increases the risk of cancer, the exact source of this risk is not currently identifiable or testable.  We discussed that 5 - 10% of breast cnacer is hereditary, with most cases associated with BRCA mutations.  There are other genes that can be associated with hereditary breast cancer syndromes.  These include ATM, CHEK2 and PALB2.  Management is based on gene specific risks.  We discussed that testing is beneficial for several reasons including knowing how to follow individuals after completing their treatment, identifying whether potential treatment options such as PARP inhibitors would be beneficial,  and understand if other family members could be at risk for cancer and allow them to undergo genetic testing.   We reviewed the characteristics, features and inheritance patterns of hereditary cancer syndromes. We also discussed genetic testing, including the appropriate family members to test, the process of testing, insurance coverage and turn-around-time for results. We discussed the implications of a negative, positive, carrier and/or variant of uncertain significant result. Maria Gallegos  was offered a common hereditary cancer panel (36+ genes) and an expanded pan-cancer panel (70+ genes). Maria Gallegos was informed of the benefits and limitations of each panel, including that expanded pan-cancer panels contain genes that do not have clear management guidelines at this point in time.  We also discussed that as the number of genes included on a panel increases, the chances of variants of uncertain significance increases. Maria Gallegos decided to pursue genetic testing for the CancerNext+RNAinsight gene panel.   The [facility] CancerNext+RNAinsight Panel includes sequencing, rearrangement analysis, and RNA analysis for the following 39 genes: APC, ATM, BAP1, BARD1, BMPR1A, BRCA1,  BRCA2, BRIP1, CDH1, CDKN2A, CHEK2, FH, FLCN, MET, MLH1, MSH2, MSH6, MUTYH, NF1, NTHL1, PALB2, PMS2, PTEN, RAD51C, RAD51D, SMAD4, STK11, TP53, TSC1, TSC2, and VHL (sequencing and deletion/duplication); AXIN2, HOXB13, MBD4, MSH3, POLD1 and POLE (sequencing only); EPCAM and GREM1 (deletion/duplication only).   Based on Maria Gallegos's family history of cancer, she meets medical criteria for genetic testing. Though Maria Gallegos is not personally affected, there are no affected family members that are willing/able/available to undergo hereditary cancer testing.  Therefore, Maria Gallegos the most informative family member available.  Despite that she meets criteria, she may still have an out of pocket cost. We discussed that if her out of pocket cost for testing is over $100, the laboratory will call and confirm whether she wants to proceed with testing.  If the out of pocket cost of testing is less than $100 she will be billed by the genetic testing laboratory.   We discussed that some people do not want to undergo genetic testing due to fear of genetic discrimination.  The Genetic Information Nondiscrimination Act (GINA) was signed into federal law in 2008. GINA prohibits health insurers and most employers from discriminating against individuals based on genetic information (including the results of genetic tests and family history information). According to GINA, health insurance companies cannot consider genetic information to be a preexisting condition, nor can they use it to make decisions regarding coverage or rates. GINA also makes it illegal for most employers to use genetic information in making decisions about hiring, firing, promotion, or terms of employment. It is important to note that GINA does not offer protections for life insurance, disability insurance, or long-term care insurance. GINA does not apply to those in the [employer], those who work for companies with less than 15 employees, and new life  insurance or long-term disability insurance policies.  Health status due to a cancer diagnosis is not protected under GINA. More information about GINA can be found by visiting [employer].  The Tyrer-Cuzick model is one of multiple prediction models developed to estimate an individual's lifetime risk of developing breast cancer. The Tyrer-Cuzick model is endorsed by the [employer] (NCCN). This model includes many risk factors such as family history, endogenous estrogen exposure, and benign breast disease. The calculation is highly-dependent on the accuracy of clinical data provided by the patient and can change over time. The Tyrer-Cuzick model may be repeated to reflect new information in her personal or family  history in the future. Maria Gallegos is not considered to be at high risk for breast cancer.    PLAN: After considering the risks, benefits, and limitations, Maria Gallegos provided informed consent to pursue genetic testing and the blood sample was sent to [facility] for analysis of the CancerNext+RNA. Results should be available within approximately 2-3 weeks' time, at which point they will be disclosed by telephone to Maria Gallegos, as will any additional recommendations warranted by these results. Maria Gallegos will receive a summary of her genetic counseling visit and a copy of her results once available. This information will also be available in Epic.   Lastly, we encouraged Maria Gallegos to remain in contact with cancer genetics annually so that we can continuously update the family history and inform her of any changes in cancer genetics and testing that may be of benefit for this family.   Maria Gallegos questions were answered to her satisfaction today. Our contact information was provided should additional questions or concerns arise. Thank you for the referral and allowing us  to share in the care of your patient.   Aybree Lanyon P. [place], MS,  CGC Licensed, Patent attorney [email or web address] phone: [phone]  50 minutes were spent on the date of the encounter in service to the patient including preparation, face-to-face consultation, documentation and care coordination.  The patient was seen alone.  Drs. Johnna Nakai, and/or Gudena were available for questions, if needed..    _______________________________________________________________________ For Office Staff:  Number of people involved in session: 1 Was an Intern/ student involved with case: no

## 2023-01-24 ENCOUNTER — Ambulatory Visit: Payer: Medicaid Other

## 2023-01-24 VITALS — BP 127/80 | Ht 69.0 in | Wt 174.0 lb

## 2023-01-24 DIAGNOSIS — Z30013 Encounter for initial prescription of injectable contraceptive: Secondary | ICD-10-CM | POA: Diagnosis not present

## 2023-01-24 DIAGNOSIS — Z309 Encounter for contraceptive management, unspecified: Secondary | ICD-10-CM | POA: Diagnosis not present

## 2023-01-24 DIAGNOSIS — Z3042 Encounter for surveillance of injectable contraceptive: Secondary | ICD-10-CM

## 2023-01-24 DIAGNOSIS — Z3009 Encounter for other general counseling and advice on contraception: Secondary | ICD-10-CM

## 2023-01-24 NOTE — Progress Notes (Signed)
13w 0d post depo. Voices no concerns. Depo given today per order by Aliene Altes, FNP dated 08/23/2022. Tolerated well in RUOQ. Next depo due 04/11/2023; patient aware.   Abagail Kitchens, RN

## 2023-01-27 ENCOUNTER — Ambulatory Visit
Admission: RE | Admit: 2023-01-27 | Discharge: 2023-01-27 | Disposition: A | Discharge status: Home / Self Care | Payer: Medicaid Other | Source: Ambulatory Visit | Attending: Nurse Practitioner | Admitting: Nurse Practitioner

## 2023-01-27 ENCOUNTER — Ambulatory Visit
Admission: RE | Admit: 2023-01-27 | Discharge: 2023-01-27 | Disposition: A | Discharge status: Home / Self Care | Payer: Medicaid Other | Source: Ambulatory Visit | Attending: Nurse Practitioner

## 2023-01-27 ENCOUNTER — Other Ambulatory Visit: Payer: Medicaid Other

## 2023-01-27 DIAGNOSIS — N6022 Fibroadenosis of left breast: Secondary | ICD-10-CM | POA: Diagnosis present

## 2023-01-27 DIAGNOSIS — R928 Other abnormal and inconclusive findings on diagnostic imaging of breast: Secondary | ICD-10-CM | POA: Insufficient documentation

## 2023-01-27 DIAGNOSIS — N63 Unspecified lump in unspecified breast: Secondary | ICD-10-CM | POA: Insufficient documentation

## 2023-01-27 HISTORY — PX: BREAST BIOPSY: SHX20

## 2023-01-28 LAB — SURGICAL PATHOLOGY

## 2023-01-29 ENCOUNTER — Other Ambulatory Visit: Payer: Self-pay | Admitting: Nurse Practitioner

## 2023-01-31 ENCOUNTER — Ambulatory Visit: Payer: Medicaid Other | Admitting: Nurse Practitioner

## 2023-01-31 VITALS — BP 120/78 | HR 86 | Temp 97.8°F | Ht 69.0 in | Wt 170.6 lb

## 2023-01-31 DIAGNOSIS — F419 Anxiety disorder, unspecified: Secondary | ICD-10-CM

## 2023-01-31 DIAGNOSIS — F32A Depression, unspecified: Secondary | ICD-10-CM

## 2023-01-31 DIAGNOSIS — Z803 Family history of malignant neoplasm of breast: Secondary | ICD-10-CM | POA: Diagnosis not present

## 2023-01-31 DIAGNOSIS — K219 Gastro-esophageal reflux disease without esophagitis: Secondary | ICD-10-CM | POA: Diagnosis not present

## 2023-01-31 DIAGNOSIS — Z13220 Encounter for screening for lipoid disorders: Secondary | ICD-10-CM

## 2023-01-31 DIAGNOSIS — D751 Secondary polycythemia: Secondary | ICD-10-CM

## 2023-01-31 DIAGNOSIS — Z1329 Encounter for screening for other suspected endocrine disorder: Secondary | ICD-10-CM

## 2023-01-31 MED ORDER — ESCITALOPRAM OXALATE 10 MG PO TABS: 10.0000 mg | ORAL_TABLET | Freq: Every day | ORAL | 11 refills | Status: DC

## 2023-01-31 NOTE — Assessment & Plan Note (Signed)
Recent mammogram, ultrasound, and biopsy are all benign. Dense breasts and a family history of breast cancer are noted. Genetic testing is completed, but results are not yet available. Await genetic testing results. Depending on the results, consider referral to a high-risk breast clinic and/or alternating mammogram and MRI every 6 months.

## 2023-01-31 NOTE — Progress Notes (Signed)
Bethanie Dicker, NP-C Phone: [phone]  Maria Gallegos is a 38 y.o. female who presents today for follow up.   Discussed the use of AI scribe software for clinical note transcription with the patient, who gave verbal consent to proceed.  History of Present Illness   The patient, who was started on Lexapro four weeks ago, reports a significant improvement in their mood and energy levels. They describe feeling 'better' and having 'more energy' since switching from Cymbalta to Lexapro. The patient has been taking only half the prescribed dose of Lexapro and wishes to continue at this dosage.  In addition to their mood disorder, the patient has been managing acid reflux with Protonix, which they report is effective with only minimal reflux symptoms depending on their diet. They have a known history of stomach issues, which they report as unchanged and not worsening.  The patient recently underwent a mammogram, ultrasound, and biopsy due to dense breast tissue and a family history of breast cancer. The biopsy results were benign, and they are awaiting results from genetic testing.  The patient also reports a decrease in anxiety-related chest pain since starting Lexapro. They had a recent breast biopsy, which has resulted in some residual pain. They were previously on Bentyl, prescribed by GI, but are not taking it regularly anymore.  The patient's blood pressure has been well-controlled, and they are not due for any other routine screenings until next year.      Social History   Tobacco Use  Smoking Status Every Day   Current packs/day: 0.50   Average packs/day: 0.5 packs/day for 38.6 years (19.3 ttl pk-yrs)   Types: Cigarettes   Start date: 06/22/2002  Smokeless Tobacco Never    Current Outpatient Medications on File Prior to Visit  Medication Sig Dispense Refill   pantoprazole (PROTONIX) 40 MG tablet Take 1 tablet (40 mg total) by mouth daily. 30 tablet 11   Current  Facility-Administered Medications on File Prior to Visit  Medication Dose Route Frequency Provider Last Rate Last Admin   medroxyPROGESTERone (DEPO-PROVERA) injection 150 mg  150 mg Intramuscular Q90 days    150 mg at 01/24/23 1447     ROS see history of present illness  Objective  Physical Exam Vitals:   01/31/23 1338  BP: 120/78  Pulse: 86  Temp: 97.8 F (36.6 C)  SpO2: 99%    BP Readings from Last 3 Encounters:  01/31/23 120/78  01/24/23 127/80  12/27/22 118/82   Wt Readings from Last 3 Encounters:  01/31/23 170 lb 9.6 oz (77.4 kg)  01/24/23 174 lb (78.9 kg)  12/27/22 172 lb 3.2 oz (78.1 kg)    Physical Exam Constitutional:      General: She is not in acute distress.    Appearance: Normal appearance.  HENT:     Head: Normocephalic.  Cardiovascular:     Rate and Rhythm: Normal rate and regular rhythm.     Heart sounds: Normal heart sounds.  Pulmonary:     Effort: Pulmonary effort is normal.     Breath sounds: Normal breath sounds.  Skin:    General: Skin is warm and dry.  Neurological:     General: No focal deficit present.     Mental Status: She is alert.  Psychiatric:        Mood and Affect: Mood normal.        Behavior: Behavior normal.    Assessment/Plan: Please see individual problem list.  Anxiety and depression Assessment & Plan: There is  significant improvement in symptoms since starting Lexapro 4 weeks ago, with increased energy and improved mood. PHQ and GAD scores have greatly improved. Currently taking half the prescribed dose, continue Lexapro at this dose with the option to increase to the full dose if needed. Encouraged to contact if worsening symptoms, unusual behavior changes or suicidal thoughts occur.   Orders: -     VITAMIN D 25 Hydroxy (Vit-D Deficiency, Fractures) -     Escitalopram Oxalate; Take 1 tablet (10 mg total) by mouth daily.  Dispense: 30 tablet; Refill: 11  Gastroesophageal reflux disease, unspecified whether  esophagitis present Assessment & Plan: Minimal reflux symptoms are reported, and dietary triggers are known. There is no trouble swallowing, no blood in stool, and no worsening abdominal pain. Continue Protonix for acid reflux.  Orders: -     Comprehensive metabolic panel  Family history of breast cancer Assessment & Plan: Recent mammogram, ultrasound, and biopsy are all benign. Dense breasts and a family history of breast cancer are noted. Genetic testing is completed, but results are not yet available. Await genetic testing results. Depending on the results, consider referral to a high-risk breast clinic and/or alternating mammogram and MRI every 6 months.   Polycythemia Assessment & Plan: Managed by Hematology with biannual phlebotomies. We will check CBC today.   Orders: -     CBC with Differential/Platelet  Thyroid disorder screen -     TSH  Lipid screening -     Lipid panel   Return in about 1 year (around 01/31/2024) for Annual Exam, sooner as needed.   Bethanie Dicker, NP-C [facility]

## 2023-01-31 NOTE — Assessment & Plan Note (Signed)
There is significant improvement in symptoms since starting Lexapro 4 weeks ago, with increased energy and improved mood. PHQ and GAD scores have greatly improved. Currently taking half the prescribed dose, continue Lexapro at this dose with the option to increase to the full dose if needed. Encouraged to contact if worsening symptoms, unusual behavior changes or suicidal thoughts occur.

## 2023-01-31 NOTE — Assessment & Plan Note (Signed)
Managed by Hematology with biannual phlebotomies. We will check CBC today.

## 2023-01-31 NOTE — Assessment & Plan Note (Signed)
Minimal reflux symptoms are reported, and dietary triggers are known. There is no trouble swallowing, no blood in stool, and no worsening abdominal pain. Continue Protonix for acid reflux.

## 2023-02-01 LAB — COMPREHENSIVE METABOLIC PANEL
ALT: 16 [IU]/L (ref 0–32)
AST: 17 [IU]/L (ref 0–40)
Albumin: 4.1 g/dL (ref 3.9–4.9)
Alkaline Phosphatase: 65 [IU]/L (ref 44–121)
BUN/Creatinine Ratio: 10 (ref 9–23)
BUN: 9 mg/dL (ref 6–20)
Bilirubin Total: 0.6 mg/dL (ref 0.0–1.2)
CO2: 21 mmol/L (ref 20–29)
Calcium: 9.2 mg/dL (ref 8.7–10.2)
Chloride: 103 mmol/L (ref 96–106)
Creatinine, Ser: 0.91 mg/dL (ref 0.57–1.00)
Globulin, Total: 2.3 g/dL (ref 1.5–4.5)
Glucose: 93 mg/dL (ref 70–99)
Potassium: 3.8 mmol/L (ref 3.5–5.2)
Sodium: 141 mmol/L (ref 134–144)
Total Protein: 6.4 g/dL (ref 6.0–8.5)
eGFR: 84 mL/min/{1.73_m2} (ref >59)

## 2023-02-01 LAB — CBC WITH DIFFERENTIAL/PLATELET
Basophils Absolute: 0.1 10*3/uL (ref 0.0–0.2)
Basos: 1 %
EOS (ABSOLUTE): 0.1 10*3/uL (ref 0.0–0.4)
Eos: 1 %
Hematocrit: 45.3 % (ref 34.0–46.6)
Hemoglobin: 14.6 g/dL (ref 11.1–15.9)
Immature Grans (Abs): 0 10*3/uL (ref 0.0–0.1)
Immature Granulocytes: 0 %
Lymphocytes Absolute: 1.8 10*3/uL (ref 0.7–3.1)
Lymphs: 19 %
MCH: 29.4 pg (ref 26.6–33.0)
MCHC: 32.2 g/dL (ref 31.5–35.7)
MCV: 91 fL (ref 79–97)
Monocytes Absolute: 0.6 10*3/uL (ref 0.1–0.9)
Monocytes: 7 %
Neutrophils Absolute: 6.7 10*3/uL (ref 1.4–7.0)
Neutrophils: 72 %
Platelets: 288 10*3/uL (ref 150–450)
RBC: 4.96 x10E6/uL (ref 3.77–5.28)
RDW: 13.8 % (ref 11.7–15.4)
WBC: 9.3 10*3/uL (ref 3.4–10.8)

## 2023-02-01 LAB — VITAMIN D 25 HYDROXY (VIT D DEFICIENCY, FRACTURES): Vit D, 25-Hydroxy: 23.2 ng/mL — ABNORMAL LOW (ref 30.0–100.0)

## 2023-02-01 LAB — LIPID PANEL
Chol/HDL Ratio: 3.2 {ratio} (ref 0.0–4.4)
Cholesterol, Total: 134 mg/dL (ref 100–199)
HDL: 42 mg/dL (ref >39)
LDL Chol Calc (NIH): 71 mg/dL (ref 0–99)
Triglycerides: 118 mg/dL (ref 0–149)
VLDL Cholesterol Cal: 21 mg/dL (ref 5–40)

## 2023-02-01 LAB — TSH: TSH: 0.5 u[IU]/mL (ref 0.450–4.500)

## 2023-02-04 ENCOUNTER — Encounter: Payer: Self-pay | Admitting: Nurse Practitioner

## 2023-02-07 ENCOUNTER — Encounter: Payer: Self-pay | Admitting: Genetic Counselor

## 2023-02-07 DIAGNOSIS — Z1379 Encounter for other screening for genetic and chromosomal anomalies: Secondary | ICD-10-CM | POA: Insufficient documentation

## 2023-02-10 ENCOUNTER — Telehealth: Payer: Self-pay | Admitting: Genetic Counselor

## 2023-02-10 ENCOUNTER — Ambulatory Visit: Payer: Self-pay | Admitting: Genetic Counselor

## 2023-02-10 DIAGNOSIS — Z1379 Encounter for other screening for genetic and chromosomal anomalies: Secondary | ICD-10-CM

## 2023-02-10 NOTE — Progress Notes (Signed)
HPI:  [name] was previously seen in the [facility] due to a family history of breast cancer and concerns regarding a hereditary predisposition to cancer. Please refer to our prior cancer genetics clinic note for more information regarding our discussion, assessment and recommendations, at the time. [name] recent genetic test results were disclosed to her, as were recommendations warranted by these results. These results and recommendations are discussed in more detail below.  CANCER HISTORY:  Oncology History   No history exists.    FAMILY HISTORY:  We obtained a detailed, 4-generation family history.  Significant diagnoses are listed below: Family History  Problem Relation Age of Onset   Healthy Father    Endometriosis Sister    Bipolar disorder Sister    Alcohol abuse Maternal Grandfather    Breast cancer Paternal Grandmother 64       second breast cancer in her 39s   Cancer Paternal Grandmother    Breast cancer Paternal Aunt 71       The patient has one daughter who is cancer free.  She has two sisters and a brother who are cancer free.  By report her sisters had negative genetic testing.  Both parents are living.   The patient's father is living and has two brothers and two sisters.  One sister had breast cancer around age 24 and died at 73.  Her daughter reportedly had negative genetic testing.  The paternal grandmother had breast cancer in her 48's and a second cancer in her 2's.   The patient's mother is living at age 73.  She had one brother and five sisters who reportedly are cancer free.  There is no other reported history of cancer.   [name] is aware of previous family history of negative genetic testing for hereditary cancer risks. There is no reported Ashkenazi Jewish ancestry. There is no known consanguinity.  GENETIC TEST RESULTS: Genetic testing reported out on January 10, 2023 through the CancerNext+RNAinsight cancer panel  found no pathogenic mutations. The [facility] CancerNext+RNAinsight Panel includes sequencing, rearrangement analysis, and RNA analysis for the following 39 genes: APC, ATM, BAP1, BARD1, BMPR1A, BRCA1, BRCA2, BRIP1, CDH1, CDKN2A, CHEK2, FH, FLCN, MET, MLH1, MSH2, MSH6, MUTYH, NF1, NTHL1, PALB2, PMS2, PTEN, RAD51C, RAD51D, SMAD4, STK11, TP53, TSC1, TSC2, and VHL (sequencing and deletion/duplication); AXIN2, HOXB13, MBD4, MSH3, POLD1 and POLE (sequencing only); EPCAM and GREM1 (deletion/duplication only). The test report has been scanned into EPIC and is located under the Molecular Pathology section of the Results Review tab.  A portion of the result report is included below for reference.     We discussed with [name] that because current genetic testing is not perfect, it is possible there may be a gene mutation in one of these genes that current testing cannot detect, but that chance is small.  We also discussed, that there could be another gene that has not yet been discovered, or that we have not yet tested, that is responsible for the cancer diagnoses in the family. It is also possible there is a hereditary cause for the cancer in the family that [name] did not inherit and therefore was not identified in her testing.  Therefore, it is important to remain in touch with cancer genetics in the future so that we can continue to offer [name] the most up to date genetic testing.   ADDITIONAL GENETIC TESTING: We discussed with [name] that there are other genes that are associated with increased  cancer risk that can be analyzed. Should [name] wish to pursue additional genetic testing, we are happy to discuss and coordinate this testing, at any time.    CANCER SCREENING RECOMMENDATIONS: [name] test result is considered negative (normal).  This means that we have not identified a hereditary cause for her family history of cancer at this time. Most cancers happen by chance and this  negative test suggests that her family history of cancer may fall into this category.    Possible reasons for [name]'s negative genetic test include:  1. There may be a gene mutation in one of these genes that current testing methods cannot detect but that chance is small.  2. There could be another gene that has not yet been discovered, or that we have not yet tested, that is responsible for the cancer diagnoses in the family.  3.  There may be no hereditary risk for cancer in the family. The cancers in [name] and/or her family may be sporadic/familial or due to other genetic and environmental factors. 4. It is also possible there is a hereditary cause for the cancer in the family that [name] did not inherit.  Therefore, it is recommended she continue to follow the cancer management and screening guidelines provided by her primary healthcare provider. An individual's cancer risk and medical management are not determined by genetic test results alone. Overall cancer risk assessment incorporates additional factors, including personal medical history, family history, and any available genetic information that may result in a personalized plan for cancer prevention and surveillance  As discussed at her previous visit, based on [name]'s family of cancer, as well as her genetic test results, statistical models [employer])  and literature data were used to estimate her risk of developing breast cancer. This estimates her lifetime risk of developing breast cancer to be approximately 15.7%.  The NCCN recommends consideration of breast MRI screening as an adjunct to mammography for patients at high risk (defined as 20% or greater lifetime risk). At this time, [name] is not placed at high risk for breast cancer.  Please note that a woman's breast cancer risk changes over time. It may increase or decrease based on age and any changes to the personal and/or family medical history.  The risks and recommendations listed above apply to this patient at this point in time. In the future, she may or may not be eligible for the same medical management strategies and, in some cases, other medical management strategies may become available to her. If she is interested in an updated breast cancer risk assessment at a later date, she can contact us.      RECOMMENDATIONS FOR FAMILY MEMBERS:   Since she did not inherit a identifiable mutation in a cancer predisposition gene included on this panel, her children could not have inherited a known mutation from her in one of these genes. Individuals in this family might be at some increased risk of developing cancer, over the general population risk, simply due to the family history of cancer.  We recommended women in this family have a yearly mammogram beginning at age 67, or 73 years younger than the earliest onset of cancer, an annual clinical breast exam, and perform monthly breast self-exams. Women in this family should also have a gynecological exam as recommended by their primary provider. All family members should be referred for colonoscopy starting at age 72, or 87 years younger than the earliest onset of cancer.  FOLLOW-UP: Lastly, we discussed with [name] that cancer genetics is a rapidly advancing field and it is possible that new genetic tests will be appropriate for her and/or her family members in the future. We encouraged her to remain in contact with cancer genetics on an annual basis so we can update her personal and family histories and let her know of advances in cancer genetics that may benefit this family.   Our contact number was provided. [name] questions were answered to her satisfaction, and she knows she is welcome to call us at anytime with additional questions or concerns.   Maylon Cos, MS, [facility] Licensed, Certified Genetic Counselor [email or web address]

## 2023-02-10 NOTE — Telephone Encounter (Signed)
Revealed negative genetic testing.  Discussed that we do not know why there is cancer in the family. It could be due to a different gene that we are not testing, or maybe our current technology may not be able to pick something up.  It will be important for her to keep in contact with genetics to keep up with whether additional testing may be needed.  

## 2023-02-20 ENCOUNTER — Encounter: Payer: Self-pay | Admitting: Nurse Practitioner

## 2023-02-20 ENCOUNTER — Other Ambulatory Visit: Payer: Self-pay | Admitting: Nurse Practitioner

## 2023-02-20 DIAGNOSIS — K219 Gastro-esophageal reflux disease without esophagitis: Secondary | ICD-10-CM

## 2023-02-20 MED ORDER — FAMOTIDINE 20 MG PO TABS: 20.0000 mg | ORAL_TABLET | Freq: Every day | ORAL | 11 refills | Status: DC

## 2023-02-25 ENCOUNTER — Ambulatory Visit: Payer: Medicaid Other | Admitting: Nurse Practitioner

## 2023-02-25 ENCOUNTER — Encounter: Payer: Self-pay | Admitting: Nurse Practitioner

## 2023-02-25 VITALS — BP 110/74 | HR 105 | Temp 98.3°F | Ht 69.0 in | Wt 170.8 lb

## 2023-02-25 DIAGNOSIS — L732 Hidradenitis suppurativa: Secondary | ICD-10-CM | POA: Diagnosis not present

## 2023-02-25 DIAGNOSIS — K219 Gastro-esophageal reflux disease without esophagitis: Secondary | ICD-10-CM

## 2023-02-25 MED ORDER — METRONIDAZOLE 0.75 % EX GEL: 1.0000 | Freq: Two times a day (BID) | CUTANEOUS | 5 refills | Status: AC

## 2023-02-25 MED ORDER — DOXYCYCLINE HYCLATE 100 MG PO TABS: 100.0000 mg | ORAL_TABLET | Freq: Two times a day (BID) | ORAL | 0 refills | Status: DC

## 2023-02-25 NOTE — Assessment & Plan Note (Signed)
Protonix was ineffective, so Omeprazole was resumed. Continue Omeprazole 20mg  twice daily.

## 2023-02-25 NOTE — Progress Notes (Signed)
Bethanie Dicker, NP-C Phone: [phone]  Maria Gallegos is a 38 y.o. female who presents today for painful lump in armpit.   Discussed the use of AI scribe software for clinical note transcription with the patient, who gave verbal consent to proceed.  History of Present Illness   Maria Gallegos is a 38 year old female who presents with painful recurrent bumps in the armpits.  She has been experiencing painful bumps in her armpits, which have been recurring over the past three months. Previously, she was treated with Bactrim for a similar issue in the left armpit, but the bump has not completely resolved. The current bumps are painful, with one being more painful than the others. No bumps are present elsewhere on her body.  She has been using clean razors for shaving. She has not experienced any fever, chills, or systemic symptoms associated with the bumps. She has been applying warm compresses to the affected area, which provides some relief, but there is no drainage from the bumps.  She has a known allergy to penicillins. She mentions having switched back to her previous heartburn medication, omeprazole, taking two tablets in the morning, as the previous medication, Protonix, was not effective.      Social History   Tobacco Use  Smoking Status Every Day   Current packs/day: 0.50   Average packs/day: 0.5 packs/day for 38.7 years (19.3 ttl pk-yrs)   Types: Cigarettes   Start date: 06/22/2002  Smokeless Tobacco Never    Current Outpatient Medications on File Prior to Visit  Medication Sig Dispense Refill   escitalopram (LEXAPRO) 10 MG tablet Take 1 tablet (10 mg total) by mouth daily. 30 tablet 11   famotidine (PEPCID) 20 MG tablet Take 1 tablet (20 mg total) by mouth at bedtime. 30 tablet 11   Current Facility-Administered Medications on File Prior to Visit  Medication Dose Route Frequency Provider Last Rate Last Admin   medroxyPROGESTERone (DEPO-PROVERA) injection 150 mg   150 mg Intramuscular Q90 days    150 mg at 01/24/23 1447     ROS see history of present illness  Objective  Physical Exam Vitals:   02/25/23 1008  BP: 110/74  Pulse: (!) 105  Temp: 98.3 F (36.8 C)  SpO2: 98%    BP Readings from Last 3 Encounters:  02/25/23 110/74  01/31/23 120/78  01/24/23 127/80   Wt Readings from Last 3 Encounters:  02/25/23 170 lb 12.8 oz (77.5 kg)  01/31/23 170 lb 9.6 oz (77.4 kg)  01/24/23 174 lb (78.9 kg)    Physical Exam Constitutional:      General: She is not in acute distress.    Appearance: Normal appearance.  HENT:     Head: Normocephalic.  Cardiovascular:     Rate and Rhythm: Normal rate and regular rhythm.     Heart sounds: Normal heart sounds.  Pulmonary:     Effort: Pulmonary effort is normal.     Breath sounds: Normal breath sounds.  Chest:       Comments: 3 small bumps noted in right axilla- no erythema, drainage or tenderness present. 2 noted in left axilla- one larger and more tender on palpation. Erythema noted. No drainage present.  Skin:    General: Skin is warm and dry.  Neurological:     General: No focal deficit present.     Mental Status: She is alert.  Psychiatric:        Mood and Affect: Mood normal.  Behavior: Behavior normal.    Assessment/Plan: Please see individual problem list.  Axillary hidradenitis suppurativa Assessment & Plan: Recurrent painful abscesses are present in the axillae without systemic symptoms. Left worse than right. Emphasized the importance of hygiene and using clean razors. Discussed treatment and prevention with topical and systemic antibiotics. Start Metronidazole gel twice daily for prevention and Doxycycline 100mg  twice daily for 7 days for current abscess. If no improvement, consider referral to Dermatology. Return precautions given to patient.   Orders: -     Doxycycline Hyclate; Take 1 tablet (100 mg total) by mouth 2 (two) times daily.  Dispense: 14 tablet; Refill: 0 -      metroNIDAZOLE; Apply 1 Application topically 2 (two) times daily.  Dispense: 45 g; Refill: 5  Gastroesophageal reflux disease, unspecified whether esophagitis present Assessment & Plan: Protonix was ineffective, so Omeprazole was resumed. Continue Omeprazole 20mg  twice daily.      Return if symptoms worsen or fail to improve.   Bethanie Dicker, NP-C [facility]

## 2023-02-25 NOTE — Assessment & Plan Note (Signed)
Recurrent painful abscesses are present in the axillae without systemic symptoms. Left worse than right. Emphasized the importance of hygiene and using clean razors. Discussed treatment and prevention with topical and systemic antibiotics. Start Metronidazole gel twice daily for prevention and Doxycycline 100mg  twice daily for 7 days for current abscess. If no improvement, consider referral to Dermatology. Return precautions given to patient.

## 2023-03-06 ENCOUNTER — Encounter: Payer: Self-pay | Admitting: Nurse Practitioner

## 2023-03-06 MED ORDER — OMEPRAZOLE 40 MG PO CPDR: 40.0000 mg | DELAYED_RELEASE_CAPSULE | Freq: Two times a day (BID) | ORAL | 3 refills | Status: AC

## 2023-03-10 ENCOUNTER — Encounter: Payer: Self-pay | Admitting: Nurse Practitioner

## 2023-03-13 NOTE — Telephone Encounter (Signed)
 Tried to call pt in regards to inform her that yes Bethanie Dicker advises she goes to the ED pt vm box is full unable to leave a vm

## 2023-04-11 ENCOUNTER — Ambulatory Visit

## 2023-04-11 VITALS — BP 121/71 | Ht 69.0 in | Wt 171.5 lb

## 2023-04-11 DIAGNOSIS — Z309 Encounter for contraceptive management, unspecified: Secondary | ICD-10-CM

## 2023-04-11 DIAGNOSIS — Z3009 Encounter for other general counseling and advice on contraception: Secondary | ICD-10-CM

## 2023-04-11 DIAGNOSIS — Z3042 Encounter for surveillance of injectable contraceptive: Secondary | ICD-10-CM

## 2023-04-11 DIAGNOSIS — Z30013 Encounter for initial prescription of injectable contraceptive: Secondary | ICD-10-CM | POA: Diagnosis not present

## 2023-04-11 NOTE — Progress Notes (Signed)
 11 Weeks   0 Days since last Depo    Voices no concerns today.  Counseled to adhere to 11 to 13 week intervals between depo injections for optimal benefit.  Depo given today per order by Aliene Altes, FNP  dated 08/23/2022.  Tolerated well LUOQ.  Next depo due 06/27/2023 has reminder card.  Jerel Shepherd, RN

## 2023-04-23 ENCOUNTER — Emergency Department

## 2023-04-23 ENCOUNTER — Emergency Department
Admission: EM | Admit: 2023-04-23 | Discharge: 2023-04-23 | Disposition: A | Discharge status: Home / Self Care | Attending: Emergency Medicine | Admitting: Emergency Medicine

## 2023-04-23 ENCOUNTER — Other Ambulatory Visit: Payer: Self-pay

## 2023-04-23 DIAGNOSIS — I10 Essential (primary) hypertension: Secondary | ICD-10-CM | POA: Insufficient documentation

## 2023-04-23 DIAGNOSIS — M79641 Pain in right hand: Secondary | ICD-10-CM | POA: Insufficient documentation

## 2023-04-23 DIAGNOSIS — M25531 Pain in right wrist: Secondary | ICD-10-CM | POA: Diagnosis present

## 2023-04-23 DIAGNOSIS — F17210 Nicotine dependence, cigarettes, uncomplicated: Secondary | ICD-10-CM | POA: Diagnosis not present

## 2023-04-23 LAB — BASIC METABOLIC PANEL WITH GFR
Anion gap: 6 (ref 5–15)
BUN: 7 mg/dL (ref 6–20)
CO2: 22 mmol/L (ref 22–32)
Calcium: 8.8 mg/dL — ABNORMAL LOW (ref 8.9–10.3)
Chloride: 107 mmol/L (ref 98–111)
Creatinine, Ser: 0.88 mg/dL (ref 0.44–1.00)
GFR, Estimated: >60 mL/min (ref >60)
Glucose, Bld: 99 mg/dL (ref 70–99)
Potassium: 3.8 mmol/L (ref 3.5–5.1)
Sodium: 135 mmol/L (ref 135–145)

## 2023-04-23 LAB — CBC WITH DIFFERENTIAL/PLATELET
Abs Immature Granulocytes: 0.01 10*3/uL (ref 0.00–0.07)
Basophils Absolute: 0.1 10*3/uL (ref 0.0–0.1)
Basophils Relative: 1 %
Eosinophils Absolute: 0.2 10*3/uL (ref 0.0–0.5)
Eosinophils Relative: 4 %
HCT: 44.5 % (ref 36.0–46.0)
Hemoglobin: 14.5 g/dL (ref 12.0–15.0)
Immature Granulocytes: 0 %
Lymphocytes Relative: 36 %
Lymphs Abs: 1.9 10*3/uL (ref 0.7–4.0)
MCH: 27.9 pg (ref 26.0–34.0)
MCHC: 32.6 g/dL (ref 30.0–36.0)
MCV: 85.7 fL (ref 80.0–100.0)
Monocytes Absolute: 0.4 10*3/uL (ref 0.1–1.0)
Monocytes Relative: 7 %
Neutro Abs: 2.8 10*3/uL (ref 1.7–7.7)
Neutrophils Relative %: 52 %
Platelets: 273 10*3/uL (ref 150–400)
RBC: 5.19 MIL/uL — ABNORMAL HIGH (ref 3.87–5.11)
RDW: 16.2 % — ABNORMAL HIGH (ref 11.5–15.5)
WBC: 5.3 10*3/uL (ref 4.0–10.5)
nRBC: 0 % (ref 0.0–0.2)

## 2023-04-23 MED ORDER — KETOROLAC TROMETHAMINE 30 MG/ML IJ SOLN
15.0000 mg | Freq: Once | INTRAMUSCULAR | Status: AC
  Administered 2023-04-23: 15 mg via INTRAVENOUS
  Filled 2023-04-23: qty 1

## 2023-04-23 NOTE — ED Notes (Addendum)
 Pt has right hand pain and swelling.  No known injury.  Sx began tonight at 2000.  +pulses and sensation.  Pt alert.  Hx polycythemia

## 2023-04-23 NOTE — ED Triage Notes (Signed)
 Pt reports her right hand and wrist began to hurt today with no injury, pt states she has polycythemia and has gone 5 months without a blood draw.

## 2023-04-23 NOTE — ED Provider Notes (Signed)
 [facility] Provider Note    Event Date/Time   First MD Initiated Contact with Patient 04/23/23 0121     (approximate)   History   Hand Pain   HPI  Maria Gallegos is a 38 y.o. female who presents to the ED from home with a chief complaint of nontraumatic right hand/wrist pain x 1 day.  Patient is right-hand dominant, types for living.  Reports pain and swelling to her right wrist and hand exacerbated while making chicken nuggets.  Denies fall/trauma/injury.  Denies temperature changes, discoloration.  Denies recent fever/chills, chest pain, shortness of breath, abdominal pain, nausea, vomiting or dizziness.  Notes history of polycythemia with q43month lab draw; has gone 5 months because her doctor wanted to start checking her every 6 months.     Past Medical History   Past Medical History:  Diagnosis Date   Anxiety    Depression    Family history of breast cancer    Family history of breast cancer    GERD (gastroesophageal reflux disease)    Hypertension 03/2022     Active Problem List   Patient Active Problem List   Diagnosis Date Noted   Axillary hidradenitis suppurativa 02/25/2023   Genetic testing 02/07/2023   Gastroesophageal reflux disease 12/27/2022   Family history of breast cancer 12/27/2022   Lumbar radiculopathy 12/27/2022   Abscess of left axilla 12/27/2022   Chest pain 12/27/2022   Polycythemia 01/01/2022   Elevated blood pressure reading without diagnosis of hypertension 06/30/2020   Smoker 1/2-1 ppd 06/30/2020   Alcohol abuse, daily use 06/30/2020   Anxiety and depression 09/21/2015   History of abnormal cervical Pap smear 09/21/2015   Perforated tympanic membrane, left 12/22/2013     Past Surgical History   Past Surgical History:  Procedure Laterality Date   BREAST BIOPSY Left 01/27/2023   heart clip/ path pending   BREAST BIOPSY Left 01/27/2023   [place] LT BREAST BX W LOC DEV 1ST LESION IMG BX SPEC US GUIDE 01/27/2023  [facility]   LEEP     TONSILLECTOMY AND ADENOIDECTOMY  1997     Home Medications   Prior to Admission medications   Medication Sig Start Date End Date Taking? Authorizing Provider  doxycycline (VIBRA-TABS) 100 MG tablet Take 1 tablet (100 mg total) by mouth 2 (two) times daily. 02/25/23   Bethanie Dicker, NP  escitalopram (LEXAPRO) 10 MG tablet Take 1 tablet (10 mg total) by mouth daily. 01/31/23   Bethanie Dicker, NP  famotidine (PEPCID) 20 MG tablet Take 1 tablet (20 mg total) by mouth at bedtime. Patient not taking: Reported on 04/11/2023 02/20/23   Bethanie Dicker, NP  metroNIDAZOLE (METROGEL) 0.75 % gel Apply 1 Application topically 2 (two) times daily. 02/25/23   Bethanie Dicker, NP  omeprazole (PRILOSEC) 40 MG capsule Take 1 capsule (40 mg total) by mouth in the morning and at bedtime. 03/06/23 03/05/24  Bethanie Dicker, NP     Allergies  Hydrocodone-acetaminophen and Penicillins   Family History   Family History  Problem Relation Age of Onset   Healthy Father    Endometriosis Sister    Bipolar disorder Sister    Alcohol abuse Maternal Grandfather    Breast cancer Paternal Grandmother 13       second breast cancer in her 4s   Cancer Paternal Grandmother    Breast cancer Paternal Aunt 21     Physical Exam  Triage Vital Signs: ED Triage Vitals  Encounter Vitals Group  BP 04/23/23 0116 (!) 132/92     Systolic BP Percentile --      Diastolic BP Percentile --      Pulse Rate 04/23/23 0116 (!) 110     Resp 04/23/23 0116 18     Temp 04/23/23 0116 98.4 F (36.9 C)     Temp Source 04/23/23 0116 Oral     SpO2 04/23/23 0116 100 %     Weight 04/23/23 0115 175 lb (79.4 kg)     Height 04/23/23 0115 5\' 9"  (1.753 m)     Head Circumference --      Peak Flow --      Pain Score 04/23/23 0115 10     Pain Loc --      Pain Education --      Exclude from Growth Chart --     Updated Vital Signs: BP (!) 132/92   Pulse (!) 110   Temp 98.4 F (36.9 C) (Oral)   Resp 18   Ht 5\' 9"   (1.753 m)   Wt 79.4 kg   SpO2 100%   BMI 25.84 kg/m    General: Awake, no distress.  CV:  RRR.  Good peripheral perfusion.  Resp:  Normal effort.  CTAB. Abd:  No distention.  Other:  Mild swelling to right wrist.  Tender to palpation wrist and hand with limited range of motion secondary to pain.  2+ radial pulse.  Brisk, less than 5-second cap refill.  Symmetrically warm limb without evidence for ischemia.   ED Results / Procedures / Treatments  Labs (all labs ordered are listed, but only abnormal results are displayed) Labs Reviewed  CBC WITH DIFFERENTIAL/PLATELET - Abnormal; Notable for the following components:      Result Value   RBC 5.19 (*)    RDW 16.2 (*)    All other components within normal limits  BASIC METABOLIC PANEL WITH GFR - Abnormal; Notable for the following components:   Calcium 8.8 (*)    All other components within normal limits  SEDIMENTATION RATE     EKG  None   RADIOLOGY I have independently visualized interpreted patient's imaging study as well as noted the radiology interpretation:  X-ray right hand/wrist: Negative  Official radiology report(s): DG Hand Complete Right Result Date: 04/23/2023 CLINICAL DATA:  Right hand pain and swelling for 1 day EXAM: RIGHT HAND - COMPLETE 3+ VIEW COMPARISON:  None Available. FINDINGS: There is no evidence of fracture or dislocation. There is no evidence of arthropathy or other focal bone abnormality. Soft tissues are unremarkable. IMPRESSION: No acute abnormality noted. Electronically Signed   By: Alcide Clever M.D.   On: 04/23/2023 02:15   DG Wrist Complete Right Result Date: 04/23/2023 CLINICAL DATA:  Right wrist pain, initial encounter EXAM: RIGHT WRIST - COMPLETE 3+ VIEW COMPARISON:  None Available. FINDINGS: There is no evidence of fracture or dislocation. There is no evidence of arthropathy or other focal bone abnormality. Soft tissues are unremarkable. IMPRESSION: No acute abnormality noted. Electronically  Signed   By: Alcide Clever M.D.   On: 04/23/2023 02:14     PROCEDURES:  Critical Care performed: No  Procedures   MEDICATIONS ORDERED IN ED: Medications  ketorolac (TORADOL) 30 MG/ML injection 15 mg (15 mg Intravenous Given 04/23/23 0139)     IMPRESSION / MDM / ASSESSMENT AND PLAN / ED COURSE  I reviewed the triage vital signs and the nursing notes.  38 year old female presenting with nontraumatic right wrist/hand pain/swelling.  Will obtain basic lab work including ESR, plain film images, administer IV ketorolac and reassess.  Patient's presentation is most consistent with acute, uncomplicated illness.  Clinical Course as of 04/23/23 0427  Wed Apr 23, 2023  0426 Patient became irate at waiting for lab work and demanded nurse take her IV out so she can go home.  She did not wait for discharge paperwork. [JS]    Clinical Course User Index [JS] Norlene Beavers, MD   FINAL CLINICAL IMPRESSION(S) / ED DIAGNOSES   Final diagnoses:  Right hand pain  Right wrist pain     Rx / DC Orders   ED Discharge Orders     None        Note:  This document was prepared using Dragon voice recognition software and may include unintentional dictation errors.   Matheus Spiker J, MD 04/23/23 [phone]

## 2023-05-15 ENCOUNTER — Encounter: Payer: Self-pay | Admitting: Nurse Practitioner

## 2023-05-15 ENCOUNTER — Other Ambulatory Visit: Payer: Self-pay | Admitting: Nurse Practitioner

## 2023-05-15 DIAGNOSIS — L732 Hidradenitis suppurativa: Secondary | ICD-10-CM

## 2023-05-23 ENCOUNTER — Other Ambulatory Visit: Payer: Self-pay | Admitting: *Deleted

## 2023-05-23 DIAGNOSIS — D751 Secondary polycythemia: Secondary | ICD-10-CM

## 2023-05-26 ENCOUNTER — Inpatient Hospital Stay: Payer: Medicaid Other | Admitting: Oncology

## 2023-05-26 ENCOUNTER — Encounter: Payer: Self-pay | Admitting: Oncology

## 2023-05-26 ENCOUNTER — Inpatient Hospital Stay: Payer: Medicaid Other

## 2023-05-26 ENCOUNTER — Inpatient Hospital Stay: Payer: Medicaid Other | Attending: Oncology

## 2023-05-26 VITALS — BP 129/93 | HR 91 | Temp 98.3°F | Resp 16 | Ht 69.0 in | Wt 174.0 lb

## 2023-05-26 DIAGNOSIS — Z803 Family history of malignant neoplasm of breast: Secondary | ICD-10-CM | POA: Insufficient documentation

## 2023-05-26 DIAGNOSIS — D751 Secondary polycythemia: Secondary | ICD-10-CM | POA: Diagnosis present

## 2023-05-26 DIAGNOSIS — F17210 Nicotine dependence, cigarettes, uncomplicated: Secondary | ICD-10-CM | POA: Diagnosis not present

## 2023-05-26 LAB — CBC WITH DIFFERENTIAL/PLATELET
Abs Immature Granulocytes: 0.02 10*3/uL (ref 0.00–0.07)
Basophils Absolute: 0 10*3/uL (ref 0.0–0.1)
Basophils Relative: 1 %
Eosinophils Absolute: 0.2 10*3/uL (ref 0.0–0.5)
Eosinophils Relative: 2 %
HCT: 48.6 % — ABNORMAL HIGH (ref 36.0–46.0)
Hemoglobin: 15.6 g/dL — ABNORMAL HIGH (ref 12.0–15.0)
Immature Granulocytes: 0 %
Lymphocytes Relative: 24 %
Lymphs Abs: 1.7 10*3/uL (ref 0.7–4.0)
MCH: 28.2 pg (ref 26.0–34.0)
MCHC: 32.1 g/dL (ref 30.0–36.0)
MCV: 87.9 fL (ref 80.0–100.0)
Monocytes Absolute: 0.4 10*3/uL (ref 0.1–1.0)
Monocytes Relative: 6 %
Neutro Abs: 4.7 10*3/uL (ref 1.7–7.7)
Neutrophils Relative %: 67 %
Platelets: 262 10*3/uL (ref 150–400)
RBC: 5.53 MIL/uL — ABNORMAL HIGH (ref 3.87–5.11)
RDW: 17.3 % — ABNORMAL HIGH (ref 11.5–15.5)
WBC: 7 10*3/uL (ref 4.0–10.5)
nRBC: 0 % (ref 0.0–0.2)

## 2023-05-26 NOTE — Progress Notes (Signed)
 Wanted to see if it was possible that she had a blood clot in her wrists.

## 2023-05-26 NOTE — Progress Notes (Signed)
No phlebotomy today.

## 2023-05-26 NOTE — Progress Notes (Signed)
 [facility]  [phone] [phone]  ID: Maria Gallegos OB: [birth year]  MR#: [id number]  UXL#:[id number]  Patient Care Team: Bluford Burkitt, NP as PCP - General (Nurse Practitioner)  CHIEF COMPLAINT: Polycythemia.  INTERVAL HISTORY: Patient returns to clinic today for repeat laboratory work, further evaluation, and consideration of additional phlebotomy.  She was recently seen in the emergency room for some hand pain and swelling, but no fracture was noted and resolved without intervention.  She currently feels well and is asymptomatic.  She has no neurologic complaints.  She denies any recent fevers or illnesses.  She has a good appetite and denies weight loss.  She has no chest pain, shortness of breath, cough, or hemoptysis.  She denies any nausea, vomiting, constipation, or diarrhea.  She has no urinary complaints.  Patient offers no further specific complaints today.  REVIEW OF SYSTEMS:   Review of Systems  Constitutional: Negative.  Negative for fever, malaise/fatigue and weight loss.  Respiratory: Negative.  Negative for cough, hemoptysis and shortness of breath.   Cardiovascular: Negative.  Negative for chest pain and leg swelling.  Gastrointestinal: Negative.  Negative for abdominal pain and diarrhea.  Genitourinary: Negative.  Negative for dysuria.  Musculoskeletal: Negative.  Negative for back pain.  Skin: Negative.  Negative for rash.  Neurological: Negative.  Negative for dizziness, focal weakness, weakness and headaches.  Psychiatric/Behavioral: Negative.  The patient is not nervous/anxious.     As per HPI. Otherwise, a complete review of systems is negative.  PAST MEDICAL HISTORY: Past Medical History:  Diagnosis Date   Anxiety    Depression    Family history of breast cancer    Family history of breast cancer    GERD (gastroesophageal reflux disease)    Hypertension 03/2022    PAST SURGICAL HISTORY: Past Surgical  History:  Procedure Laterality Date   BREAST BIOPSY Left 01/27/2023   heart clip/ path pending   BREAST BIOPSY Left 01/27/2023   US  LT BREAST BX W LOC DEV 1ST LESION IMG BX SPEC US  GUIDE 01/27/2023 [facility]   LEEP     TONSILLECTOMY AND ADENOIDECTOMY  1997    FAMILY HISTORY: Family History  Problem Relation Age of Onset   Healthy Father    Endometriosis Sister    Bipolar disorder Sister    Alcohol abuse Maternal Grandfather    Breast cancer Paternal Grandmother 50       second breast cancer in her 60s   Cancer Paternal Grandmother    Breast cancer Paternal Aunt 38    ADVANCED DIRECTIVES (Y/N):  N  HEALTH MAINTENANCE: Social History   Tobacco Use   Smoking status: Every Day    Current packs/day: 0.50    Average packs/day: 0.5 packs/day for 38.9 years (19.5 ttl pk-yrs)    Types: Cigarettes    Start date: 06/22/2002   Smokeless tobacco: Never  Vaping Use   Vaping status: Never Used  Substance Use Topics   Alcohol use: Yes    Alcohol/week: 2.0 standard drinks of alcohol    Types: 1 Cans of beer, 1 Shots of liquor per week    Comment: daily use   Drug use: Never     Colonoscopy:  PAP:  Bone density:  Lipid panel:  Allergies  Allergen Reactions   Hydrocodone-Acetaminophen Itching and Rash    Other Reaction(s): Not available   Penicillins Itching and Rash    Other Reaction(s): Not available    Current Outpatient Medications  Medication Sig Dispense Refill  doxycycline  (VIBRA -TABS) 100 MG tablet Take 1 tablet (100 mg total) by mouth 2 (two) times daily. 14 tablet 0   escitalopram  (LEXAPRO ) 10 MG tablet Take 1 tablet (10 mg total) by mouth daily. 30 tablet 11   famotidine  (PEPCID ) 20 MG tablet Take 1 tablet (20 mg total) by mouth at bedtime. (Patient not taking: Reported on 05/26/2023) 30 tablet 11   metroNIDAZOLE  (METROGEL ) 0.75 % gel Apply 1 Application topically 2 (two) times daily. 45 g 5   omeprazole  (PRILOSEC) 40 MG capsule Take 1 capsule (40 mg total)  by mouth in the morning and at bedtime. 180 capsule 3   Current Facility-Administered Medications  Medication Dose Route Frequency Provider Last Rate Last Admin   medroxyPROGESTERone  (DEPO-PROVERA ) injection 150 mg  150 mg Intramuscular Q90 days    150 mg at 04/11/23 1405    OBJECTIVE: Vitals:   05/26/23 1430  BP: (!) 129/93  Pulse: 91  Resp: 16  Temp: 98.3 F (36.8 C)  SpO2: 100%     Body mass index is 25.7 kg/m.    ECOG FS:0 - Asymptomatic  General: Well-developed, well-nourished, no acute distress. Eyes: Pink conjunctiva, anicteric sclera. HEENT: Normocephalic, moist mucous membranes. Lungs: No audible wheezing or coughing. Heart: Regular rate and rhythm. Abdomen: Soft, nontender, no obvious distention. Musculoskeletal: No edema, cyanosis, or clubbing. Neuro: Alert, answering all questions appropriately. Cranial nerves grossly intact. Skin: No rashes or petechiae noted. Psych: Normal affect.  LAB RESULTS:  Lab Results  Component Value Date   NA 135 04/23/2023   K 3.8 04/23/2023   CL 107 04/23/2023   CO2 22 04/23/2023   GLUCOSE 99 04/23/2023   BUN 7 04/23/2023   CREATININE 0.88 04/23/2023   CALCIUM 8.8 (L) 04/23/2023   PROT 6.4 01/31/2023   ALBUMIN 4.1 01/31/2023   AST 17 01/31/2023   ALT 16 01/31/2023   ALKPHOS 65 01/31/2023   BILITOT 0.6 01/31/2023   GFRNONAA >60 04/23/2023    Lab Results  Component Value Date   WBC 7.0 05/26/2023   NEUTROABS 4.7 05/26/2023   HGB 15.6 (H) 05/26/2023   HCT 48.6 (H) 05/26/2023   MCV 87.9 05/26/2023   PLT 262 05/26/2023   Lab Results  Component Value Date   IRON 109 12/12/2021   TIBC 386 12/12/2021   IRONPCTSAT 28 12/12/2021   Lab Results  Component Value Date   FERRITIN 72 12/12/2021     STUDIES: No results found.  ASSESSMENT: Polycythemia.  PLAN:    Polycythemia: Possibly related to tobacco use.  Previously, iron stores, erythropoietin  level, hemochromatosis mutation, JAK2, CALR, MPL, and E12-15  mutations are all negative.  Carbon monoxide levels are only mildly elevated at 4.5.  Patient's hemoglobin is only mildly elevated at 15.6 today.  Patient does not require additional phlebotomy.  Return to clinic in 6 months with repeat laboratory work and further evaluation.   Hypertension: Patient's blood pressure is within normal limits today.  I spent a total of 20 minutes reviewing chart data, face-to-face evaluation with the patient, counseling and coordination of care as detailed above.   Patient expressed understanding and was in agreement with this plan. She also understands that She can call clinic at any time with any questions, concerns, or complaints.    Shellie Dials, MD   05/26/2023 2:39 PM

## 2023-06-19 ENCOUNTER — Encounter: Payer: Self-pay | Admitting: Nurse Practitioner

## 2023-07-25 ENCOUNTER — Ambulatory Visit

## 2023-07-25 VITALS — BP 132/86 | Ht 69.0 in | Wt 172.5 lb

## 2023-07-25 DIAGNOSIS — Z309 Encounter for contraceptive management, unspecified: Secondary | ICD-10-CM | POA: Diagnosis not present

## 2023-07-25 DIAGNOSIS — Z30013 Encounter for initial prescription of injectable contraceptive: Secondary | ICD-10-CM | POA: Diagnosis not present

## 2023-07-25 DIAGNOSIS — Z3042 Encounter for surveillance of injectable contraceptive: Secondary | ICD-10-CM

## 2023-07-25 DIAGNOSIS — Z3009 Encounter for other general counseling and advice on contraception: Secondary | ICD-10-CM

## 2023-07-25 NOTE — Progress Notes (Signed)
 15 weeks post depo. Voices no concerns. Depo given today per order by VEAR Bers, FNP dated 08/23/2022. Tolerated well in RUOQ. Patient physical due 08/24/2023; plans to schedule annual physical along with next depo. Next depo due 10/10/2023; patient aware.  Doyce CINDERELLA Shuck, RN

## 2023-07-28 ENCOUNTER — Encounter: Payer: Self-pay | Admitting: Oncology

## 2023-07-28 NOTE — Progress Notes (Signed)
 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  Routine testing for mutations in JAK2 and MPL was negative.  She was subsequently started on a program of phlebotomy that lowered her hemoglobin.    Her presentation is not consistent with polycythemia vera.  We test for mutations in all of the JAK2 exons and MPL exons associated with erythrocytosis.  Negative testing essentially rules out a myeloproliferative neoplasm.  We can also rule out congenital erythrocytosis given her history of blood donations, pregnancy, and labs from 2018. We can also rule out conditions associated with high EPO levels including renal and liver tumors, renal artery stenosis, and leiomyomas since her EPO level remained WNL even with phlebotomy.  We did talk about getting an echo with a bubble study, which I think is reasonable.    The main driver for her erythrocytosis is likely to be smoking.  The increase in her carboxyhgb strongly suggests it.  Of note, cigarettes can increase Hgb without increasing EPO levels.  I suspect there is a genetic predilection to erythrocytosis.  There are SNPs in the HFE gene that are associated with PCV.  It is possible that something similar is going on here.  There is also a variant form of Epo that has recently been reported that could explain why her hgb is disproportionally high for the amount of smoking she is doing.  Unfortunately, clinical assays are not currently available.   Patients do not require cytoreduction unless they are symptomatic from their erythrocytosis (fatigue, tinnitus, HA, dizziness, paresthesia, mental slowing, visual changes, atypical CP, dyspnea, palpitations).  At this point, I suspect she feels worse because of iron deficiency.  Correcting her iron deficiency will likely mean a return to her erythrocytosis.  We will find out which condition makes her feel worse.   IF it does, she can return to phlebotomy.  From a rheology standpoint, I would consider treatment if her HCT was over 60%.   I should add the possibility of treated erythrocytosis prior to her presentation in 2023.  In short, she may have been iron deficient due to menses or pregnancy.  This would suggest the possibility of a congenital disorder. Such d/o do not require treatment.   In summary, the appropriate clinical intervention in patients with non-MPN associated erythrocytosis is to rule out clinically important  causes.  In her case, I would recommend smoking cessation and an echo with a bubble study.    PLAN: 1) Start ferrous sulfate 325 mg on M, W, F  When her hgb increases, assess her symptoms.  IF they are worse, she can return to phlebotomy.  2) I strongly recommended smoking cessation.  I would repeat her carboxyhgb after 8 weeks.   3) Cardiac echo bubble study.    4) Consider rereferrral if her HCT is > 60% or if there is an increase in other cell lines. I would do additional testing at that point.   HEME HX: 06/21/16: HCT 45 06/27/16: HCT 46.4 09/17/20: Hgb 15.3;  HCT 48.6 09/25/21: Hgb 18.7; HCT 55.6; Ferritin 72 11/08/21: 8.5/19.7/56.8/218 Ratio: 2.88 MCV: 96.1 RDW: 13.6 12/12/21: Ferritin: 72; Carboxy hgb: 4.5; DEPO: 3.1; 9.2/19.6/57.8/265 Started phlebotomy at every 3 months 01/08/22: Hgb 15.9; HCT 47.7 CT A/B/D: Negative  07/02/22: Hgb 15.7; HCT 46.2; EPO 4.8 11/2022: Phlebotomy every 6 months 03/07/23: Hgb 15.3; HCT 47.7; MCV: 89.7 05/26/23: 7.0/15.6/48.6/262 Ratio: 3.11 Polycythemia: Possibly  related to tobacco use. Previously, iron stores, erythropoietin  level, hemochromatosis mutation, JAK2, CALR, MPL, and E12-15 mutations are all negative. Carbon monoxide levels are only mildly elevated at 4.5.  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 She was referred by Dr. Velinda Reusing for evaluation of elevated hemoglobin levels.  She experiences significant fatigue and an increase in headaches, describing a feeling of being 'just exhausted'. These symptoms have persisted despite undergoing phlebotomy, which did not alleviate her condition.  She has a history of elevated hemoglobin levels, initially suspected to be due to a myeloproliferative neoplasm, which was ruled out following a comprehensive blood smear analysis. Her carboxyhemoglobin levels were found to be elevated, and she is a smoker.  She has undergone phlebotomy to manage her high red blood cell count, but this did not result in any improvement in her symptoms. Her iron levels are reported to be very low, likely due to the blood removal process, which may contribute to her fatigue.  She is not currently taking any supplements or medications that could account for her symptoms. Her medication list is described as short, and she is not taking any supplements.  She resides in [place], near the [employer], and is a patient at [facility]. She is a smoker, which is relevant to her elevated hemoglobin levels.  ATTESTATION: Maria Gallegos participated in a video visit from her home in [place].   Algorithm for dealing with secondary erythrocytosis  (Don't forget about CML)  Defn: On 2 visits that are at least 2 months apart  Men: Hb?>?18.5 g/dL and/or Yru?>?9.47 Women: Hb?>?16.5 g/dL and/or Yru?>?9.51 Dx  approach Clues:  PCV tends to have a higher hct/hgb ratio (> 3.1), higher RDW, lower MCV, higher RBC count.   Rule out MPN JAK2 testing Epo: IF epo level is low, consider a bone marrow biopsy Check Retic count Higher retic may suggest high turnover state Rule out a congenital form by reviewing old CBCs and CBCs from other family members If congenital Low Epo:  Consider EPOR mutations SH2B3/LNK exon 2 mutations or polymorphisms BCOR mutations  Normal to high EPO O2 dissociation curve  If abnormal: Consider hemoglobinopathy, 2,3 BPGM deficiency, mutations in the PIEZO1;  Hemoglobinopathies often have a P50 < 24 mmhg; (see spread sheet for calculator using VBG) Nl: Consider mutations in VHL, PHD2, HIF2A, EPO If acquired Meds: testosterone, androgen, erythropoietin , diuretics, and certain antidiabetic agents (sodium-glucose cotransporter 2 inhibitors; e.g., canagliflozin). Initial testing ABG, CO, 6 min walk Overnight Oximetry; STOP BANG (snoring, sleepiness, apnea, HTN, BMI, neck circumference, gender) Abdominal US  to assess for renal/hepatic tumors/renal artery stenosis.  UA for blood  Ferritin, TIBC, Iron (to r/o hemochromatosis and iron deficiency, which could mask the severity)  B12, LDH (elevated in certain MPNs); B2M (elevated in PCV) Carbon Monoxide  Further investigations are dictated by clinical findings Neurological symptoms: Brain imaging (hemangiopblastoma); paradoxical embolism Cardiac: Echocardiogram with shunt study Uterine: (Leiomyomas) Hypercalcemia: PTH adenoma/cancer Flushing, HTN:  Pheochromocytoma Smoking Hx: CO  testing Rare acquired disorders TEMPI syndrome: Telangiectasias, elevated serum EPO, erythrocytosis, monoclonal gammopathy, perinephric fluid collections and intrapulmonary shunting  POEMS: Polyneuropathy, organomegaly, endocrinopathy, M-protein, skin changes syndrome Tumors associated with erythrocytosis: Cerebellar hemangioblastomas, meningiomas,  pheochromocytoma, uterine leiomyomas, parathyroid adenomas, hepatocellular carcinoma, and renal cell carcinoma   Treatment H/O thrombosis: Anticoagulation (venous); aspirin (arterial) H/O symptoms: Fatigue, tinnitus, HA, dizziness, paresthesia, mental slowing, visual changes, atypical CP, dyspnea, palpitations. Trial of phlebotomy  only continue if symptoms are relieved  Idiopathic: No evidence for routine phlebotomy or cytoreduction; phlebotomy may make it worse by increasing Epo and hyperviscosity COPD/OSA No difference in thrombosis rate based on high and low hgb In a study of 1604 OSA pts, only 1.6% had increased hemoglobin Cyanotic heart dz Higher risk of CNS events in pts who were phlebotomized Hct > 65% with sx can be phlebotomized  Hydrea can lower sx Post renal transplant Incidence of erythrocytosis after renal transplant ranges from 8 to 15% Treat with ACE inhibitors and ARBS Theophylline is another agent, which significantly reduced Hct and Epo levels in renal transplant patients  Androgen  In patients with Hct above 54%, testosterone is withheld until Hct is at an acceptable level below 50%, and therapy resumed at a reduced dose

## 2023-08-06 ENCOUNTER — Encounter: Payer: Self-pay | Admitting: Nurse Practitioner

## 2023-08-06 ENCOUNTER — Other Ambulatory Visit: Payer: Self-pay | Admitting: Medical Genetics

## 2023-08-08 ENCOUNTER — Other Ambulatory Visit
Admission: RE | Admit: 2023-08-08 | Discharge: 2023-08-08 | Disposition: A | Discharge status: Home / Self Care | Payer: Self-pay | Source: Ambulatory Visit | Attending: Medical Genetics | Admitting: Medical Genetics

## 2023-08-08 NOTE — Telephone Encounter (Signed)
 Received return call from patient Maria Gallegos. Verified preferred days/times for scheduling echocardiogram.  Patient asked for a Friday afternoon so she can go after getting off at 12:00 on Fridays.  Scheduled on 8/8. Patient said she will look for appointment in mychart - no need to call back and confirm date and time.   [name]

## 2023-08-08 NOTE — Telephone Encounter (Addendum)
 Patient Maria Gallegos was called in attempt number 1 to reach them regarding scheduling their upcoming appointments on next available.   The call resulted in: Message left  Please schedule the patient upon their return call on next available for the following:   echocardiogram  Thank you,  Carly R Levonne

## 2023-08-18 ENCOUNTER — Other Ambulatory Visit: Payer: Self-pay | Admitting: *Deleted

## 2023-08-18 DIAGNOSIS — D751 Secondary polycythemia: Secondary | ICD-10-CM

## 2023-08-19 LAB — GENECONNECT MOLECULAR SCREEN: Genetic Analysis Overall Interpretation: NEGATIVE

## 2023-08-20 ENCOUNTER — Telehealth: Payer: Self-pay | Admitting: Oncology

## 2023-08-20 ENCOUNTER — Telehealth: Payer: Self-pay | Admitting: Licensed Clinical Social Worker

## 2023-08-20 NOTE — Telephone Encounter (Signed)
 Pt called and wants to switch providers. Currently is with Dr.Finnegan.  Pt went to [facility] and had a second opinion. Pt was tested for a genetic gene and results came back. Pt stated she has reached out to Dr.Finnegan and he has not responded to her and will not order any genetic testing for her. Pt stated that Dr.Finnegan advised her to go back to [facility].  Pt states she does not want to drive to [facility] for every appt as [facility] is closer to her home.  Please advise.

## 2023-08-20 NOTE — Telephone Encounter (Addendum)
 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 This means that at this time, it is unknown if this variant is associated with an increased risk for disease or if it is benign, but most uncertain variants are reclassified to benign. It should not be used to make medical management decisions. With time, the laboratory will determine the significance of this variant, if any.     Per [facility], [facility] actually classifies this variant as Benign  and even if it was identified, would not be included on the report. Regardless of whether this variant is inherited or somatic, since it is classified as a VUS at [facility] and Benign at [facility], there would not be any change to management or concern for SETBP1-related conditions. Recommended she reach out to her ordering provider if she still has concerns about this result. Patient did not request follow up with us .   This is the somatic panel she had:     Her result:     And [facility]Var entry showing [facility] classifies as Benign.:

## 2023-09-26 ENCOUNTER — Encounter: Payer: Self-pay | Admitting: Nurse Practitioner

## 2023-10-10 ENCOUNTER — Ambulatory Visit

## 2023-10-10 VITALS — BP 122/86 | HR 89 | Ht 69.0 in | Wt 172.8 lb

## 2023-10-10 DIAGNOSIS — Z309 Encounter for contraceptive management, unspecified: Secondary | ICD-10-CM | POA: Diagnosis not present

## 2023-10-10 DIAGNOSIS — Z30013 Encounter for initial prescription of injectable contraceptive: Secondary | ICD-10-CM | POA: Diagnosis not present

## 2023-10-10 DIAGNOSIS — Z3042 Encounter for surveillance of injectable contraceptive: Secondary | ICD-10-CM

## 2023-10-10 DIAGNOSIS — Z3009 Encounter for other general counseling and advice on contraception: Secondary | ICD-10-CM

## 2023-10-10 MED ORDER — MEDROXYPROGESTERONE ACETATE 150 MG/ML IM SUSP
150.0000 mg | Freq: Once | INTRAMUSCULAR | Status: AC
  Administered 2023-10-10: 150 mg via INTRAMUSCULAR

## 2023-10-10 NOTE — Progress Notes (Signed)
 Pt is here FP visit. Depo IM injection given to pt at the [facility]  and pt tolerated well to the injection with no complications. Condoms declined and reminder card given. Opportunity given to Patient to ask questions for any clarifications, questions answered. Wilkie Drought, RN.

## 2023-10-10 NOTE — Progress Notes (Signed)
 [employer] HEALTH DEPARTMENT [facility] [address] [place] [address] Main phone: [phone]  Family Planning Visit - Repeat Yearly Visit  Subjective:  Maria Gallegos is a 38 y.o. G1P1001  being seen today for an annual wellness visit and to discuss contraception options. The patient is currently using hormonal injection for pregnancy prevention. Patient does not want a pregnancy in the next year.   Patient has the following medical problems:  Patient Active Problem List   Diagnosis Date Noted   Axillary hidradenitis suppurativa 02/25/2023   Genetic testing 02/07/2023   Gastroesophageal reflux disease 12/27/2022   Family history of breast cancer 12/27/2022   Lumbar radiculopathy 12/27/2022   Abscess of left axilla 12/27/2022   Chest pain 12/27/2022   Polycythemia 01/01/2022   Elevated blood pressure reading without diagnosis of hypertension 06/30/2020   Smoker 1/2-1 ppd 06/30/2020   Alcohol abuse, daily use 06/30/2020   Anxiety and depression 09/21/2015   History of abnormal cervical Pap smear 09/21/2015   Perforated tympanic membrane, left 12/22/2013   Chief Complaint  Patient presents with   Annual Exam    PE/ Depo   HPI Patient reports desire for Depo Provera  today. Newly suspected to have heart failure w/ reduced EF - seeing cardiology at [facility] and her work up is ongoing. She is working on quitting smoking and was recently put on Wellbutrin. Discussed situation with Dr. Macario. Normal pap tests in 2021 and 2017. Due in 2026. LEEP was done in 2015/unsure about treated histology as record not in system.  Declines STI testing today. Patient denies other concerns.  Had mammogram this year due to a lump, FH of breast cancer. Benign biopsy.   Review of Systems  All other systems reviewed and are negative.  See flowsheet for further details and programmatic requirements Hyperlink available at the top of the signed note in  blue.  Flow sheet content below:  Pregnancy Intention Screening Does the patient want to become pregnant in the next year?: No Does the patient's partner want to become pregnant in the next year?: No Would the patient like to discuss contraceptive options today?: Yes Other:  Password: [email or web address] Sexual History What age did you start your period?: 14 How often do you have your period?: no periods Date of last sex?:  (4 months ago) Has the patient had unprotected sex within the last 5 days?: No Do you have sex with men, women, both men and women?: Men only In the past 2 months how many partners have you had sex with?: 1 In the past 12 months, how many partners have you had sex with?: 0 Is it possible that any of your sex partners in the past 12 months had sex with someone else whild they were still in a sexual relationship with you?: No What ways do you have sex?: Vaginal Do you or your partner use condoms and/or dental dams every time you have vaginal, oral or anal sex?: Yes Do you douche?: No Date of last HIV test?: 09/25/23 Have you ever had an STD?: No Have any of your partners had an STD?: No Have you or your partner ever shot up drugs?: No Have any of your partners used drugs in the past?: No Have you or your partners exchanged money or drugs for sex?: No  Diabetes screening This patient is 38 y.o. with a BMI of Body mass index is 25.52 kg/m.SABRA  Is patient eligible for diabetes screening (age >35 and BMI >  25)?  yes  Was Hgb A1c ordered? No, done elsewhere  STI screening Patient reports 1 of partners in last year.  Does this patient desire STI screening?  No - declines Declines all blood tests.  Cervical Cancer Screening  Result Date Procedure Results Follow-ups  04/26/2019 IGP, Aptima HPV DIAGNOSIS:: Comment Specimen adequacy:: Comment Clinician Provided ICD10: Comment Performed by:: Comment QC reviewed by:: Comment PAP Smear Comment: . Note:: Comment Test Methodology:  Comment HPV Aptima: Negative   09/21/2015 HM PAP SMEAR HM Pap smear: Negative, HPV negative    Health Maintenance Due  Topic Date Due   Hepatitis C Screening  Never done   Pneumococcal Vaccine (1 of 2 - PCV) Never done   HPV VACCINES (1 - 3-dose SCDM series) Never done   Influenza Vaccine  08/08/2023   COVID-19 Vaccine (1 - 2024-25 season) Never done   The following portions of the patient's history were reviewed and updated as appropriate: allergies, current medications, past family history, past medical history, past social history, past surgical history and problem list. Problem list updated.  Objective:   Vitals:   10/10/23 1414  BP: 122/86  Pulse: 89  Weight: 172 lb 12.8 oz (78.4 kg)  Height: 5' 9 (1.753 m)   Physical Exam Constitutional:      Appearance: Normal appearance.  HENT:     Head: Normocephalic.     Mouth/Throat:     Mouth: Mucous membranes are moist.     Pharynx: Oropharynx is clear. No pharyngeal swelling, oropharyngeal exudate or posterior oropharyngeal erythema.  Eyes:     General: No scleral icterus.       Right eye: No discharge.        Left eye: No discharge.  Cardiovascular:     Heart sounds: Normal heart sounds, S1 normal and S2 normal.  Pulmonary:     Effort: Pulmonary effort is normal.     Breath sounds: Normal breath sounds.  Lymphadenopathy:     Cervical: No cervical adenopathy.  Skin:    General: Skin is warm and dry.  Neurological:     Mental Status: She is alert.  Psychiatric:        Mood and Affect: Mood normal.        Behavior: Behavior normal.    Assessment and Plan:  [name] is a 38 y.o. female G1P1001 presenting to the [facility] Department for an yearly wellness and contraception visit  Family planning  Contraception counseling:  Reviewed options based on patient desire and reproductive life plan. Patient is interested in Hormonal Injection. This was provided to the patient today, with a discussion  about possibly having to switch methods due to her heart disease.  Risks, benefits, and typical effectiveness rates were reviewed.  Questions were answered.  Written information was also given to the patient to review.    The patient will follow up in  3 months for surveillance.  The patient was told to call with any further questions, or with any concerns about this method of contraception.  Emphasized use of condoms 100% of the time for STI prevention.  Emergency Contraception Precautions (ECP): Patient assessed for need of ECP. She is not a candidate based on depo provera  within recommended dates.   2. Surveillance for Depo-Provera  contraception  - Give today, follow up in 3 months to discuss cardiology work up and contraceptive plan - medroxyPROGESTERone  (DEPO-PROVERA ) injection 150 mg   No follow-ups on file.  Future Appointments  Date Time Provider  Department Center  11/26/2023  2:15 PM [facility]MO LAB [facility] None  11/26/2023  2:45 PM Jacobo Evalene PARAS, MD [facility] None  11/26/2023  3:00 PM [facility] 4 [facility] None  02/03/2024  3:20 PM Gretel App, NP [facility] [address] FOR[place], NP

## 2023-10-17 ENCOUNTER — Encounter: Payer: Self-pay | Admitting: Family Medicine

## 2023-10-17 DIAGNOSIS — I428 Other cardiomyopathies: Secondary | ICD-10-CM | POA: Insufficient documentation

## 2023-10-17 DIAGNOSIS — I429 Cardiomyopathy, unspecified: Secondary | ICD-10-CM | POA: Insufficient documentation

## 2023-11-25 NOTE — ED Triage Notes (Signed)
 Presents for mid sternal CP x 1 week with SOB at rest. States that pain is now across shoulder blades radiating into left neck. Recent diagnosis of CHF.

## 2023-11-26 ENCOUNTER — Encounter: Payer: Self-pay | Admitting: Oncology

## 2023-11-26 ENCOUNTER — Inpatient Hospital Stay: Attending: Oncology

## 2023-11-26 ENCOUNTER — Inpatient Hospital Stay: Admitting: Oncology

## 2023-11-26 ENCOUNTER — Inpatient Hospital Stay

## 2023-11-26 ENCOUNTER — Telehealth: Payer: Self-pay | Admitting: Family Medicine

## 2023-11-26 VITALS — BP 128/83 | HR 79 | Temp 97.9°F | Resp 20 | Ht 69.0 in | Wt 173.0 lb

## 2023-11-26 VITALS — BP 119/77 | HR 78 | Resp 16

## 2023-11-26 DIAGNOSIS — I429 Cardiomyopathy, unspecified: Secondary | ICD-10-CM | POA: Insufficient documentation

## 2023-11-26 DIAGNOSIS — D751 Secondary polycythemia: Secondary | ICD-10-CM

## 2023-11-26 DIAGNOSIS — F17210 Nicotine dependence, cigarettes, uncomplicated: Secondary | ICD-10-CM | POA: Diagnosis not present

## 2023-11-26 DIAGNOSIS — Z803 Family history of malignant neoplasm of breast: Secondary | ICD-10-CM | POA: Diagnosis not present

## 2023-11-26 LAB — CBC WITH DIFFERENTIAL/PLATELET
Abs Immature Granulocytes: 0.01 K/uL (ref 0.00–0.07)
Basophils Absolute: 0.1 K/uL (ref 0.0–0.1)
Basophils Relative: 1 %
Eosinophils Absolute: 0.2 K/uL (ref 0.0–0.5)
Eosinophils Relative: 3 %
HCT: 50.2 % — ABNORMAL HIGH (ref 36.0–46.0)
Hemoglobin: 16.6 g/dL — ABNORMAL HIGH (ref 12.0–15.0)
Immature Granulocytes: 0 %
Lymphocytes Relative: 29 %
Lymphs Abs: 1.9 K/uL (ref 0.7–4.0)
MCH: 28.9 pg (ref 26.0–34.0)
MCHC: 33.1 g/dL (ref 30.0–36.0)
MCV: 87.3 fL (ref 80.0–100.0)
Monocytes Absolute: 0.5 K/uL (ref 0.1–1.0)
Monocytes Relative: 8 %
Neutro Abs: 3.9 K/uL (ref 1.7–7.7)
Neutrophils Relative %: 59 %
Platelets: 270 K/uL (ref 150–400)
RBC: 5.75 MIL/uL — ABNORMAL HIGH (ref 3.87–5.11)
RDW: 15.3 % (ref 11.5–15.5)
WBC: 6.5 K/uL (ref 4.0–10.5)
nRBC: 0 % (ref 0.0–0.2)

## 2023-11-26 LAB — FERRITIN: Ferritin: 28 ng/mL (ref 11–307)

## 2023-11-26 LAB — IRON AND TIBC
Iron: 376 ug/dL — ABNORMAL HIGH (ref 28–170)
Saturation Ratios: 84 % — ABNORMAL HIGH (ref 10.4–31.8)
TIBC: 449 ug/dL (ref 250–450)
UIBC: 73 ug/dL

## 2023-11-26 NOTE — Patient Instructions (Signed)

## 2023-11-26 NOTE — Progress Notes (Signed)
 [facility]  Telephone:([phone] [phone]) [phone]  ID: Maria Gallegos OB: Mar 02, 1985  MR#: [id number]  RDW#:[id number]  Patient Care Team: Gretel App, NP as PCP - General (Nurse Practitioner) Jacobo Evalene PARAS, MD as Consulting Physician (Oncology)  CHIEF COMPLAINT: Polycythemia.  INTERVAL HISTORY: Patient returns to clinic today for repeat laboratory work, further evaluation, and consideration of additional phlebotomy.  She has been recently evaluated at [facility] and found to have a cardiomyopathy of unclear etiology.  Additional workup by [facility] hematology did not reveal any distinct etiology of her polycythemia.  She currently feels well and is asymptomatic.  She has no neurologic complaints.  She denies any recent fevers or illnesses.  She has a good appetite and denies weight loss.  She has no chest pain, shortness of breath, cough, or hemoptysis.  She denies any nausea, vomiting, constipation, or diarrhea.  She has no urinary complaints.  Patient offers no further specific complaints today.  REVIEW OF SYSTEMS:   Review of Systems  Constitutional: Negative.  Negative for fever, malaise/fatigue and weight loss.  Respiratory: Negative.  Negative for cough, hemoptysis and shortness of breath.   Cardiovascular: Negative.  Negative for chest pain and leg swelling.  Gastrointestinal: Negative.  Negative for abdominal pain and diarrhea.  Genitourinary: Negative.  Negative for dysuria.  Musculoskeletal: Negative.  Negative for back pain.  Skin: Negative.  Negative for rash.  Neurological: Negative.  Negative for dizziness, focal weakness, weakness and headaches.  Psychiatric/Behavioral: Negative.  The patient is not nervous/anxious.     As per HPI. Otherwise, a complete review of systems is negative.  PAST MEDICAL HISTORY: Past Medical History:  Diagnosis Date   Anxiety    Depression    Family history of breast cancer    Family history of breast cancer     GERD (gastroesophageal reflux disease)    Hypertension 03/2022    PAST SURGICAL HISTORY: Past Surgical History:  Procedure Laterality Date   BREAST BIOPSY Left 01/27/2023   heart clip/ path pending   BREAST BIOPSY Left 01/27/2023   US  LT BREAST BX W LOC DEV 1ST LESION IMG BX SPEC US  GUIDE 01/27/2023 [facility]-MAMMOGRAP[facility]   LEEP     TONSILLECTOMY AND ADENOIDECTOMY  1997    FAMILY HISTORY: Family History  Problem Relation Age of Onset   Healthy Father    Endometriosis Sister    Bipolar disorder Sister    Alcohol abuse Maternal Grandfather    Breast cancer Paternal Grandmother 37       second breast cancer in her 85s   Cancer Paternal Grandmother    Breast cancer Paternal Aunt 46    ADVANCED DIRECTIVES (Y/N):  N  HEALTH MAINTENANCE: Social History   Tobacco Use   Smoking status: Every Day    Current packs/day: 0.50    Average packs/day: 0.5 packs/day for 39.4 years (19.7 ttl pk-yrs)    Types: Cigarettes    Start date: 06/22/2002   Smokeless tobacco: Never  Vaping Use   Vaping status: Never Used  Substance Use Topics   Alcohol use: Yes    Alcohol/week: 2.0 standard drinks of alcohol    Types: 1 Cans of beer, 1 Shots of liquor per week    Comment: daily use   Drug use: Never     Colonoscopy:  PAP:  Bone density:  Lipid panel:  Allergies  Allergen Reactions   Hydrocodone-Acetaminophen Itching and Rash    Other Reaction(s): Not available   Penicillins Itching and Rash  Other Reaction(s): Not available    Current Outpatient Medications  Medication Sig Dispense Refill   buPROPion (WELLBUTRIN XL) 300 MG 24 hr tablet Take 300 mg by mouth.     carvedilol (COREG) 3.125 MG tablet Take 3.125 mg by mouth.     clindamycin (CLEOCIN T) 1 % lotion Apply topically.     diclofenac Sodium (VOLTAREN) 1 % GEL Apply 1 g topically.     escitalopram  (LEXAPRO ) 10 MG tablet Take 1 tablet (10 mg total) by mouth daily. 30 tablet 11   losartan (COZAAR) 25 MG tablet Take 25 mg by mouth  daily.     naltrexone (DEPADE) 50 MG tablet Take 50 mg by mouth.     nicotine (NICODERM CQ - DOSED IN MG/24 HOURS) 14 mg/24hr patch 14 mg daily.     nicotine polacrilex (COMMIT) 4 MG lozenge Place 4 mg inside cheek.     omeprazole  (PRILOSEC) 40 MG capsule Take 1 capsule (40 mg total) by mouth in the morning and at bedtime. 180 capsule 3   doxycycline  (VIBRA -TABS) 100 MG tablet Take 1 tablet (100 mg total) by mouth 2 (two) times daily. (Patient not taking: Reported on 11/26/2023) 14 tablet 0   famotidine  (PEPCID ) 20 MG tablet Take 1 tablet (20 mg total) by mouth at bedtime. (Patient not taking: Reported on 05/26/2023) 30 tablet 11   metroNIDAZOLE  (METROGEL ) 0.75 % gel Apply 1 Application topically 2 (two) times daily. (Patient not taking: Reported on 11/26/2023) 45 g 5   No current facility-administered medications for this visit.    OBJECTIVE: Vitals:   11/26/23 1516  BP: 128/83  Pulse: 79  Resp: 20  Temp: 97.9 F (36.6 C)  SpO2: 100%     Body mass index is 25.55 kg/m.    ECOG FS:0 - Asymptomatic  General: Well-developed, well-nourished, no acute distress. Eyes: Pink conjunctiva, anicteric sclera. HEENT: Normocephalic, moist mucous membranes. Lungs: No audible wheezing or coughing. Heart: Regular rate and rhythm. Abdomen: Soft, nontender, no obvious distention. Musculoskeletal: No edema, cyanosis, or clubbing. Neuro: Alert, answering all questions appropriately. Cranial nerves grossly intact. Skin: No rashes or petechiae noted. Psych: Normal affect.  LAB RESULTS:  Lab Results  Component Value Date   NA 135 04/23/2023   K 3.8 04/23/2023   CL 107 04/23/2023   CO2 22 04/23/2023   GLUCOSE 99 04/23/2023   BUN 7 04/23/2023   CREATININE 0.88 04/23/2023   CALCIUM 8.8 (L) 04/23/2023   PROT 6.4 01/31/2023   ALBUMIN 4.1 01/31/2023   AST 17 01/31/2023   ALT 16 01/31/2023   ALKPHOS 65 01/31/2023   BILITOT 0.6 01/31/2023   GFRNONAA >60 04/23/2023    Lab Results  Component  Value Date   WBC 6.5 11/26/2023   NEUTROABS 3.9 11/26/2023   HGB 16.6 (H) 11/26/2023   HCT 50.2 (H) 11/26/2023   MCV 87.3 11/26/2023   PLT 270 11/26/2023   Lab Results  Component Value Date   IRON 109 12/12/2021   TIBC 386 12/12/2021   IRONPCTSAT 28 12/12/2021   Lab Results  Component Value Date   FERRITIN 72 12/12/2021     STUDIES: No results found.  ASSESSMENT: Polycythemia.  PLAN:    Polycythemia: Possibly related to tobacco use.  Previously, iron stores, erythropoietin  level, hemochromatosis mutation, JAK2, CALR, MPL, and E12-15 mutations are all negative.  Carbon monoxide levels are only mildly elevated at 4.5.  Patient's hemoglobin is mildly elevated at 16.6, therefore we will proceed with 500 mL phlebotomy.  Goal hemoglobin  will be less than 16.0.  No further intervention is needed.  Return to clinic in 6 months with repeat laboratory work, further evaluation by APP, and consideration of phlebotomy if needed.   Cardiomyopathy: By report, patient's EF is approximately 35%.  Continue follow-up and treatment per [facility] cardiology.  I spent a total of 30 minutes reviewing chart data, face-to-face evaluation with the patient, counseling and coordination of care as detailed above.   Patient expressed understanding and was in agreement with this plan. She also understands that She can call clinic at any time with any questions, concerns, or complaints.    Evalene JINNY Reusing, MD   11/26/2023 3:22 PM

## 2023-11-26 NOTE — Progress Notes (Signed)
 Maria Gallegos presents today for phlebotomy per MD orders. Phlebotomy procedure started at 1600 and ended at 1615. 500 mls removed. Patient tolerated procedure well. IV needle removed intact.

## 2023-11-26 NOTE — Progress Notes (Signed)
 Patient wants to know if Dr. Jacobo has had another patient who has ever went into heart failure.

## 2023-11-26 NOTE — Telephone Encounter (Signed)
 Spoke with patient after reviewed her chart. Cardiology has noted her cardiomyopathy is non-ischemic. As such, she is still considered low enough risk to continue with depo provera . [name] also reports she spoke with her cardiologist about depo and they told [her] it was fine.  Will plan for depo administration at her scheduled appointment on 12/26/23.   Maria Helling, MD 11/26/23  12:51 PM

## 2023-11-28 ENCOUNTER — Encounter: Payer: Self-pay | Admitting: Oncology

## 2023-12-18 ENCOUNTER — Other Ambulatory Visit: Payer: Self-pay | Admitting: Nurse Practitioner

## 2023-12-18 DIAGNOSIS — Z1231 Encounter for screening mammogram for malignant neoplasm of breast: Secondary | ICD-10-CM

## 2023-12-26 ENCOUNTER — Ambulatory Visit

## 2023-12-26 VITALS — BP 144/88 | HR 90 | Ht 69.0 in | Wt 174.0 lb

## 2023-12-26 DIAGNOSIS — I428 Other cardiomyopathies: Secondary | ICD-10-CM

## 2023-12-26 DIAGNOSIS — Z309 Encounter for contraceptive management, unspecified: Secondary | ICD-10-CM | POA: Diagnosis not present

## 2023-12-26 DIAGNOSIS — Z30013 Encounter for initial prescription of injectable contraceptive: Secondary | ICD-10-CM | POA: Diagnosis not present

## 2023-12-26 DIAGNOSIS — Z3042 Encounter for surveillance of injectable contraceptive: Secondary | ICD-10-CM

## 2023-12-26 MED ORDER — MEDROXYPROGESTERONE ACETATE 150 MG/ML IM SUSP
150.0000 mg | INTRAMUSCULAR | Status: AC
  Administered 2023-12-26: 150 mg via INTRAMUSCULAR

## 2023-12-26 NOTE — Progress Notes (Signed)
 1. Encounter for Depo-Provera  contraception (Primary)  - medroxyPROGESTERone  (DEPO-PROVERA ) injection 150 mg  2. Nonischemic cardiomyopathy (HCC) -reviewed with Dr. Macario- ok to give depo   [facility] FNP-C

## 2023-12-26 NOTE — Progress Notes (Signed)
 Pt is here for Depo IM injection. Depo IM injection given to pt at the [facility] and pt tolerated well to the injection with no complications. Condoms declined and reminder card given. Opportunity given to Patient to ask questions for any clarifications, questions answered. Wilkie Drought, RN.

## 2023-12-29 ENCOUNTER — Ambulatory Visit
Admission: EM | Admit: 2023-12-29 | Discharge: 2023-12-29 | Disposition: A | Discharge status: Home / Self Care | Attending: Emergency Medicine | Admitting: Emergency Medicine

## 2023-12-29 ENCOUNTER — Telehealth: Payer: Self-pay

## 2023-12-29 DIAGNOSIS — H00012 Hordeolum externum right lower eyelid: Secondary | ICD-10-CM

## 2023-12-29 HISTORY — DX: Heart failure, unspecified: I50.9

## 2023-12-29 MED ORDER — POLYMYXIN B-TRIMETHOPRIM 10000-0.1 UNIT/ML-% OP SOLN: 1.0000 [drp] | Freq: Four times a day (QID) | OPHTHALMIC | 0 refills | Status: AC

## 2023-12-29 NOTE — ED Provider Notes (Signed)
 " [facility]    CSN: [id number] Arrival date & time: 12/29/23  1056      History   Chief Complaint Chief Complaint  Patient presents with   Eye Problem    HPI Maria Gallegos is a 38 y.o. female.  Patient presents with 3-day history of a painful bump on the inside of her right lower eyelid.  The bump is more painful and red today.  No eye injury, drainage, vision change, fever.  No treatments at home.  The history is provided by the patient and medical records.    Past Medical History:  Diagnosis Date   Anxiety    CHF (congestive heart failure) (HCC)    Depression    Family history of breast cancer    Family history of breast cancer    GERD (gastroesophageal reflux disease)    Hypertension 03/2022    Patient Active Problem List   Diagnosis Date Noted   Nonischemic cardiomyopathy (HCC) 10/17/2023   Axillary hidradenitis suppurativa 02/25/2023   Genetic testing 02/07/2023   Gastroesophageal reflux disease 12/27/2022   Family history of breast cancer 12/27/2022   Lumbar radiculopathy 12/27/2022   Abscess of left axilla 12/27/2022   Chest pain 12/27/2022   Polycythemia 01/01/2022   Elevated blood pressure reading without diagnosis of hypertension 06/30/2020   Smoker 1/2-1 ppd 06/30/2020   Alcohol abuse, daily use 06/30/2020   Anxiety and depression 09/21/2015   History of abnormal cervical Pap smear 09/21/2015   Perforated tympanic membrane, left 12/22/2013    Past Surgical History:  Procedure Laterality Date   BREAST BIOPSY Left 01/27/2023   heart clip/ path pending   BREAST BIOPSY Left 01/27/2023   US  LT BREAST BX W LOC DEV 1ST LESION IMG BX SPEC US  GUIDE 01/27/2023 [facility]   LEEP     TONSILLECTOMY AND ADENOIDECTOMY  1997    OB History     Gravida  1   Para  1   Term  1   Preterm  0   AB  0   Living  1      SAB  0   IAB  0   Ectopic  0   Multiple  0   Live Births  1            Home Medications     Prior to Admission medications  Medication Sig Start Date End Date Taking? Authorizing Provider  carvedilol (COREG) 3.125 MG tablet Take 3.125 mg by mouth. 11/06/23 12/10/24 Yes [provider]  losartan (COZAAR) 25 MG tablet Take 25 mg by mouth daily. 11/06/23 12/10/24 Yes [provider]  omeprazole  (PRILOSEC) 40 MG capsule Take 1 capsule (40 mg total) by mouth in the morning and at bedtime. 03/06/23 03/05/24 Yes Gretel App, NP  trimethoprim -polymyxin b  (POLYTRIM ) ophthalmic solution Place 1 drop into the right eye 4 (four) times daily for 7 days. 12/29/23 01/05/24 Yes Corlis Burnard DEL, NP  buPROPion (WELLBUTRIN XL) 300 MG 24 hr tablet Take 300 mg by mouth. Patient not taking: Reported on 12/29/2023 10/01/23 09/30/24  [provider]  clindamycin (CLEOCIN T) 1 % lotion Apply topically.    [provider]  diclofenac Sodium (VOLTAREN) 1 % GEL Apply 1 g topically. 05/04/23   [provider]  doxycycline  (VIBRA -TABS) 100 MG tablet Take 1 tablet (100 mg total) by mouth 2 (two) times daily. Patient not taking: Reported on 11/26/2023 02/25/23   Gretel App, NP  escitalopram  (LEXAPRO ) 10 MG tablet Take 1  tablet (10 mg total) by mouth daily. 01/31/23   Gretel App, NP  famotidine  (PEPCID ) 20 MG tablet Take 1 tablet (20 mg total) by mouth at bedtime. Patient not taking: Reported on 05/26/2023 02/20/23   Gretel App, NP  metroNIDAZOLE  (METROGEL ) 0.75 % gel Apply 1 Application topically 2 (two) times daily. Patient not taking: Reported on 11/26/2023 02/25/23   Gretel App, NP  naltrexone (DEPADE) 50 MG tablet Take 50 mg by mouth. Patient not taking: Reported on 12/29/2023 10/01/23 09/30/24  [provider]  nicotine (NICODERM CQ - DOSED IN MG/24 HOURS) 14 mg/24hr patch 14 mg daily.    [provider]  nicotine polacrilex (COMMIT) 4 MG lozenge Place 4 mg inside cheek. 10/16/23   [provider]    Family History Family History  Problem  Relation Age of Onset   Healthy Father    Endometriosis Sister    Bipolar disorder Sister    Alcohol abuse Maternal Grandfather    Breast cancer Paternal Grandmother 51       second breast cancer in her 62s   Cancer Paternal Grandmother    Breast cancer Paternal Aunt 73    Social History Social History[1]   Allergies   Hydrocodone-acetaminophen and Penicillins   Review of Systems Review of Systems  Constitutional:  Negative for chills and fever.  Eyes:  Positive for pain and redness. Negative for discharge and visual disturbance.     Physical Exam Triage Vital Signs ED Triage Vitals [12/29/23 1342]  Encounter Vitals Group     BP      Girls Systolic BP Percentile      Girls Diastolic BP Percentile      Boys Systolic BP Percentile      Boys Diastolic BP Percentile      Pulse      Resp      Temp      Temp src      SpO2      Weight      Height      Head Circumference      Peak Flow      Pain Score 6     Pain Loc      Pain Education      Exclude from Growth Chart    No data found.  Updated Vital Signs BP 123/84   Pulse 93   Temp 98.1 F (36.7 C)   Resp 19   SpO2 98%   Visual Acuity Right Eye Distance: 20/16 Left Eye Distance: 20/20 Bilateral Distance: 20/16  Right Eye Near:   Left Eye Near:    Bilateral Near:     Physical Exam Constitutional:      General: She is not in acute distress. HENT:     Mouth/Throat:     Mouth: Mucous membranes are moist.  Eyes:     General: Vision grossly intact.     Extraocular Movements: Extraocular movements intact.     Conjunctiva/sclera: Conjunctivae normal.     Pupils: Pupils are equal, round, and reactive to light.     Comments: Small tender pustule on inner right lower eyelid with localized erythema of right lower eyelid.  No drainage.  Cardiovascular:     Rate and Rhythm: Normal rate.  Pulmonary:     Effort: Pulmonary effort is normal. No respiratory distress.  Neurological:     Mental Status: She is  alert.      UC Treatments / Results  Labs (all labs ordered are listed, but only  abnormal results are displayed) Labs Reviewed - No data to display  EKG   Radiology No results found.  Procedures Procedures (including critical care time)  Medications Ordered in UC Medications - No data to display  Initial Impression / Assessment and Plan / UC Course  I have reviewed the triage vital signs and the nursing notes.  Pertinent labs & imaging results that were available during my care of the patient were reviewed by me and considered in my medical decision making (see chart for details).    Right lower eyelid hordeolum.  Afebrile and vital signs are stable.  Discussed warm compresses.  Treating with Polytrim  eyedrops.  Instructed her to follow-up with an ophthalmologist if her symptoms are not improving.  Contact information for [facility] provided.  Education provided on stye.  ED precautions given.  She agrees to plan of care.  Final Clinical Impressions(s) / UC Diagnoses   Final diagnoses:  Hordeolum externum of right lower eyelid     Discharge Instructions      Use the antibiotic eyedrops as directed.  Apply warm compresses as directed.  See the attached information on stye.  Follow-up with an ophthalmologist if your symptoms are not improving.     ED Prescriptions     Medication Sig Dispense Auth. Provider   trimethoprim -polymyxin b  (POLYTRIM ) ophthalmic solution Place 1 drop into the right eye 4 (four) times daily for 7 days. 10 mL Corlis Burnard DEL, NP      PDMP not reviewed this encounter.    [1]  Social History Tobacco Use   Smoking status: Every Day    Current packs/day: 0.50    Average packs/day: 0.5 packs/day for 39.5 years (19.8 ttl pk-yrs)    Types: Cigarettes    Start date: 06/22/2002   Smokeless tobacco: Never  Vaping Use   Vaping status: Never Used  Substance Use Topics   Alcohol use: Yes    Alcohol/week: 2.0 standard drinks of alcohol     Types: 1 Cans of beer, 1 Shots of liquor per week    Comment: daily use   Drug use: Never     Corlis Burnard DEL, NP 12/29/23 1408  "

## 2023-12-29 NOTE — Telephone Encounter (Signed)
 Patient contacted Urgent Care requesting medications sent from today's visit be sent to another pharmacy. States that it is not in stock at [facility] [place].   Informed patient that medication is available at [facility] inside of [employer] and prescription has been sent in. Phone number of pharmacy provided for patient.

## 2023-12-29 NOTE — Discharge Instructions (Addendum)
 Use the antibiotic eyedrops as directed.  Apply warm compresses as directed.  See the attached information on stye.  Follow-up with an ophthalmologist if your symptoms are not improving.

## 2023-12-29 NOTE — ED Triage Notes (Signed)
 Patient to Urgent Care with complaints of  right sided eye pain. Reports there is a bump on the inside of her lower eyelid.  Symptoms started Friday. No drainage.

## 2024-01-16 ENCOUNTER — Ambulatory Visit
Admission: RE | Admit: 2024-01-16 | Discharge: 2024-01-16 | Disposition: A | Source: Ambulatory Visit | Attending: Nurse Practitioner

## 2024-01-16 ENCOUNTER — Encounter

## 2024-01-16 DIAGNOSIS — Z1231 Encounter for screening mammogram for malignant neoplasm of breast: Secondary | ICD-10-CM | POA: Insufficient documentation

## 2024-01-21 ENCOUNTER — Ambulatory Visit: Payer: Self-pay | Admitting: Nurse Practitioner

## 2024-02-03 ENCOUNTER — Ambulatory Visit: Payer: Medicaid Other | Admitting: Nurse Practitioner

## 2024-02-03 VITALS — BP 120/78 | HR 88 | Temp 98.1°F | Ht 69.0 in | Wt 178.2 lb

## 2024-02-03 DIAGNOSIS — D751 Secondary polycythemia: Secondary | ICD-10-CM

## 2024-02-03 DIAGNOSIS — E559 Vitamin D deficiency, unspecified: Secondary | ICD-10-CM | POA: Insufficient documentation

## 2024-02-03 DIAGNOSIS — F17200 Nicotine dependence, unspecified, uncomplicated: Secondary | ICD-10-CM

## 2024-02-03 DIAGNOSIS — I428 Other cardiomyopathies: Secondary | ICD-10-CM

## 2024-02-03 DIAGNOSIS — F1010 Alcohol abuse, uncomplicated: Secondary | ICD-10-CM

## 2024-02-03 DIAGNOSIS — Z0000 Encounter for general adult medical examination without abnormal findings: Secondary | ICD-10-CM | POA: Insufficient documentation

## 2024-02-03 DIAGNOSIS — F32A Depression, unspecified: Secondary | ICD-10-CM

## 2024-02-03 MED ORDER — LOSARTAN POTASSIUM 25 MG PO TABS: 25.0000 mg | ORAL_TABLET | Freq: Every day | ORAL | Status: AC

## 2024-02-03 NOTE — Progress Notes (Unsigned)
 " [name], NP-C Phone: [phone]  Maria Gallegos is a 39 y.o. female who presents today for annual exam.   Discussed the use of AI scribe software for clinical note transcription with the patient, who gave verbal consent to proceed.  History of Present Illness   Maria Gallegos is a 39 year old female with cardiomyopathy and polycythemia who presents for annual exam.  She has cardiomyopathy with a reduced ejection fraction of 35%. 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 She is on losartan  and carvedilol for blood pressure management, taken daily. She reports no chest pain, shortness of breath, or dizziness. She is followed by Cardiology.   She has a history of polycythemia with differing opinions on its cause, including smoking. She has undergone phlebotomy and genetic testing. She reports no headaches, abdominal pain, or skin changes.  She has reduced smoking to half a pack per day and drinks two ciders daily. She previously tried Wellbutrin for smoking cessation but experienced irritability and vivid dreams. She uses nicotine patches. Sleep disturbances are attributed to [place], and she takes magnesium to aid sleep. Her mood has improved, and she is not on mood medications.  Her family history includes breast cancer and possibly ovarian or cervical cancer. She had precancerous cervical cells removed in 2015. She is on Depo-Provera  and does not have periods.      Tobacco Use History[1]  Medications Ordered Prior to Encounter[2]   ROS see history of present illness  Objective  Physical Exam Vitals:   02/03/24 1530  BP: 120/78  Pulse: 88  Temp: 98.1 F (36.7 C)  SpO2: 98%    BP Readings from Last 3 Encounters:  02/03/24 120/78  12/29/23 123/84  12/26/23 (!) 144/88   Wt Readings from Last 3 Encounters:  02/03/24 178 lb 3.2 oz (80.8 kg)  12/26/23 174 lb (78.9 kg)  11/26/23 173 lb (78.5 kg)    Physical  Exam Constitutional:      General: She is not in acute distress.    Appearance: Normal appearance.  HENT:     Head: Normocephalic.  Cardiovascular:     Rate and Rhythm: Normal rate and regular rhythm.     Heart sounds: Normal heart sounds.  Pulmonary:     Effort: Pulmonary effort is normal.     Breath sounds: Normal breath sounds.  Skin:    General: Skin is warm and dry.  Neurological:     General: No focal deficit present.     Mental Status: She is alert.  Psychiatric:        Mood and Affect: Mood normal.        Behavior: Behavior normal.      Assessment/Plan: Please see individual problem list.  Routine general medical examination at a health care facility Assessment & Plan: Physical exam complete. We will check lab work as outlined. Pap smear is due in April. Up to date on tetanus vaccination. Declines flu and COVID vaccines. Continue routine dental and eye exams. Schedule Pap smear in April. Encourage healthy diet and regular exercise. Return to care in one year, sooner as needed.   Nonischemic cardiomyopathy (HCC) Assessment & Plan: Left ventricular ejection fraction is 35%. Cardiologist advised lifestyle modifications to reduce cardiac strain. Advised smoking cessation and reduction of alcohol intake. Blood pressure is well-controlled with losartan  and carvedilol. Continue current management and follow-up with cardiology.   Orders: -     Losartan  Potassium; Take 1 tablet (  25 mg total) by mouth daily.  Alcohol abuse, daily use Assessment & Plan: Consuming two ciders per day. Advised reduction due to cardiac concerns. Working on decreasing consumption. Declines cessation assistance.   Orders: -     Comprehensive metabolic panel with GFR  Smoker 1/2-1 ppd Assessment & Plan: Currently smoking half a pack per day. Previous cessation attempts with Wellbutrin and nicotine patch were unsuccessful. Not motivated to quit smoking. Discussed smoking cessation strategies,  including setting a quit date. Counseled on healthy benefits of smoking cessation.    Polycythemia Assessment & Plan: Elevated red blood cells, possibly secondary to smoking. Hematology evaluation at [facility] was inconclusive. Phlebotomy performed in November. Check CBC today. Continue follow up with Hematology.   Orders: -     CBC with Differential/Platelet -     Comprehensive metabolic panel with GFR  Anxiety and depression Assessment & Plan: No longer taking lexapro . She does not feel that it is needed at this time. Encouraged to contact if worsening symptoms, unusual behavior changes or suicidal thoughts occur. We will continue to monitor.    Vitamin D  deficiency -     VITAMIN D  25 Hydroxy (Vit-D Deficiency, Fractures)     Return in about 1 year (around 02/02/2025) for Annual Exam, sooner as needed.   [name], NP-C [facility] [place]     [1]  Social History Tobacco Use  Smoking Status Every Day   Current packs/day: 0.50   Average packs/day: 0.5 packs/day for 39.6 years (19.8 ttl pk-yrs)   Types: Cigarettes   Start date: 06/22/2002  Smokeless Tobacco Never  [2]  Current Outpatient Medications on File Prior to Visit  Medication Sig Dispense Refill   buPROPion (WELLBUTRIN XL) 300 MG 24 hr tablet Take 300 mg by mouth.     carvedilol (COREG) 3.125 MG tablet Take 3.125 mg by mouth.     clindamycin (CLEOCIN T) 1 % lotion Apply topically.     diclofenac Sodium (VOLTAREN) 1 % GEL Apply 1 g topically.     metroNIDAZOLE  (METROGEL ) 0.75 % gel Apply 1 Application topically 2 (two) times daily. 45 g 5   omeprazole  (PRILOSEC) 40 MG capsule Take 1 capsule (40 mg total) by mouth in the morning and at bedtime. 180 capsule 3   Current Facility-Administered Medications on File Prior to Visit  Medication Dose Route Frequency Provider Last Rate Last Admin   medroxyPROGESTERone  (DEPO-PROVERA ) injection 150 mg  150 mg Intramuscular Q90 days    150 mg at 12/26/23  1703   "

## 2024-02-04 LAB — COMPREHENSIVE METABOLIC PANEL WITH GFR
ALT: 15 U/L (ref 3–35)
AST: 14 U/L (ref 5–37)
Albumin: 4.3 g/dL (ref 3.5–5.2)
Alkaline Phosphatase: 60 U/L (ref 39–117)
BUN: 10 mg/dL (ref 6–23)
CO2: 24 meq/L (ref 19–32)
Calcium: 9.2 mg/dL (ref 8.4–10.5)
Chloride: 103 meq/L (ref 96–112)
Creatinine, Ser: 0.93 mg/dL (ref 0.40–1.20)
GFR: 78.8 mL/min
Glucose, Bld: 95 mg/dL (ref 70–99)
Potassium: 3.8 meq/L (ref 3.5–5.1)
Sodium: 136 meq/L (ref 135–145)
Total Bilirubin: 0.6 mg/dL (ref 0.2–1.2)
Total Protein: 6.8 g/dL (ref 6.0–8.3)

## 2024-02-04 LAB — CBC WITH DIFFERENTIAL/PLATELET
Basophils Absolute: 0 10*3/uL (ref 0.0–0.1)
Basophils Relative: 0.4 % (ref 0.0–3.0)
Eosinophils Absolute: 0.2 10*3/uL (ref 0.0–0.7)
Eosinophils Relative: 2.6 % (ref 0.0–5.0)
HCT: 45.4 % (ref 36.0–46.0)
Hemoglobin: 14.9 g/dL (ref 12.0–15.0)
Lymphocytes Relative: 24.7 % (ref 12.0–46.0)
Lymphs Abs: 1.6 10*3/uL (ref 0.7–4.0)
MCHC: 32.7 g/dL (ref 30.0–36.0)
MCV: 89.3 fl (ref 78.0–100.0)
Monocytes Absolute: 0.4 10*3/uL (ref 0.1–1.0)
Monocytes Relative: 6.7 % (ref 3.0–12.0)
Neutro Abs: 4.2 10*3/uL (ref 1.4–7.7)
Neutrophils Relative %: 65.6 % (ref 43.0–77.0)
Platelets: 270 10*3/uL (ref 150.0–400.0)
RBC: 5.08 Mil/uL (ref 3.87–5.11)
RDW: 16 % — ABNORMAL HIGH (ref 11.5–15.5)
WBC: 6.4 10*3/uL (ref 4.0–10.5)

## 2024-02-04 LAB — VITAMIN D 25 HYDROXY (VIT D DEFICIENCY, FRACTURES): VITD: 29.12 ng/mL — ABNORMAL LOW (ref 30.00–100.00)

## 2024-02-05 ENCOUNTER — Ambulatory Visit: Payer: Self-pay | Admitting: Nurse Practitioner

## 2024-02-09 ENCOUNTER — Ambulatory Visit: Admitting: Dermatology

## 2024-02-13 ENCOUNTER — Encounter: Payer: Self-pay | Admitting: Nurse Practitioner

## 2024-02-13 NOTE — Assessment & Plan Note (Signed)
 Elevated red blood cells, possibly secondary to smoking. Hematology evaluation at [facility] was inconclusive. Phlebotomy performed in November. Check CBC today. Continue follow up with Hematology.

## 2024-02-13 NOTE — Assessment & Plan Note (Addendum)
 Consuming two ciders per day. Advised reduction due to cardiac concerns. Working on decreasing consumption. Declines cessation assistance.

## 2024-02-13 NOTE — Assessment & Plan Note (Signed)
 Left ventricular ejection fraction is 35%. Cardiologist advised lifestyle modifications to reduce cardiac strain. Advised smoking cessation and reduction of alcohol intake. Blood pressure is well-controlled with losartan  and carvedilol. Continue current management and follow-up with cardiology.

## 2024-02-13 NOTE — Assessment & Plan Note (Signed)
 Physical exam complete. We will check lab work as outlined. Pap smear is due in April. Up to date on tetanus vaccination. Declines flu and COVID vaccines. Continue routine dental and eye exams. Schedule Pap smear in April. Encourage healthy diet and regular exercise. Return to care in one year, sooner as needed.

## 2024-02-13 NOTE — Assessment & Plan Note (Signed)
 Currently smoking half a pack per day. Previous cessation attempts with Wellbutrin and nicotine patch were unsuccessful. Not motivated to quit smoking. Discussed smoking cessation strategies, including setting a quit date. Counseled on healthy benefits of smoking cessation.

## 2024-02-13 NOTE — Assessment & Plan Note (Signed)
 No longer taking lexapro . She does not feel that it is needed at this time. Encouraged to contact if worsening symptoms, unusual behavior changes or suicidal thoughts occur. We will continue to monitor.

## 2024-05-25 ENCOUNTER — Inpatient Hospital Stay

## 2024-05-25 ENCOUNTER — Inpatient Hospital Stay: Admitting: Nurse Practitioner

## 2025-02-11 ENCOUNTER — Encounter: Admitting: Nurse Practitioner
